# Patient Record
Sex: Male | Born: 1935 | Race: White | Hispanic: No | Marital: Married | Smoking: Former smoker
Health system: Southern US, Community
[De-identification: ages and names within clinical notes are randomized; demographics above are authoritative.]

## PROBLEM LIST (undated history)

## (undated) DIAGNOSIS — M1990 Unspecified osteoarthritis, unspecified site: Secondary | ICD-10-CM

## (undated) DIAGNOSIS — N189 Chronic kidney disease, unspecified: Secondary | ICD-10-CM

## (undated) DIAGNOSIS — E119 Type 2 diabetes mellitus without complications: Secondary | ICD-10-CM

## (undated) DIAGNOSIS — Z8673 Personal history of transient ischemic attack (TIA), and cerebral infarction without residual deficits: Secondary | ICD-10-CM

## (undated) DIAGNOSIS — I350 Nonrheumatic aortic (valve) stenosis: Secondary | ICD-10-CM

## (undated) DIAGNOSIS — J449 Chronic obstructive pulmonary disease, unspecified: Secondary | ICD-10-CM

## (undated) DIAGNOSIS — K219 Gastro-esophageal reflux disease without esophagitis: Secondary | ICD-10-CM

## (undated) DIAGNOSIS — E785 Hyperlipidemia, unspecified: Secondary | ICD-10-CM

## (undated) DIAGNOSIS — R609 Edema, unspecified: Secondary | ICD-10-CM

## (undated) DIAGNOSIS — J40 Bronchitis, not specified as acute or chronic: Secondary | ICD-10-CM

## (undated) DIAGNOSIS — R05 Cough: Secondary | ICD-10-CM

## (undated) DIAGNOSIS — R053 Chronic cough: Secondary | ICD-10-CM

## (undated) DIAGNOSIS — I10 Essential (primary) hypertension: Secondary | ICD-10-CM

## (undated) DIAGNOSIS — G629 Polyneuropathy, unspecified: Secondary | ICD-10-CM

## (undated) HISTORY — DX: Unspecified osteoarthritis, unspecified site: M19.90

## (undated) HISTORY — DX: Cough: R05

## (undated) HISTORY — DX: Personal history of transient ischemic attack (TIA), and cerebral infarction without residual deficits: Z86.73

## (undated) HISTORY — DX: Bronchitis, not specified as acute or chronic: J40

## (undated) HISTORY — DX: Nonrheumatic aortic (valve) stenosis: I35.0

## (undated) HISTORY — PX: HERNIA REPAIR: SHX51

## (undated) HISTORY — PX: ANKLE SURGERY: SHX546

## (undated) HISTORY — DX: Chronic cough: R05.3

## (undated) HISTORY — DX: Type 2 diabetes mellitus without complications: E11.9

## (undated) HISTORY — DX: Hyperlipidemia, unspecified: E78.5

## (undated) HISTORY — DX: Gastro-esophageal reflux disease without esophagitis: K21.9

## (undated) HISTORY — DX: Essential (primary) hypertension: I10

## (undated) HISTORY — PX: REPLACEMENT TOTAL KNEE: SUR1224

## (undated) HISTORY — DX: Chronic obstructive pulmonary disease, unspecified: J44.9

## (undated) HISTORY — DX: Polyneuropathy, unspecified: G62.9

## (undated) HISTORY — DX: Edema, unspecified: R60.9

---

## 1997-10-04 ENCOUNTER — Emergency Department (HOSPITAL_COMMUNITY): Admission: EM | Admit: 1997-10-04 | Discharge: 1997-10-04 | Payer: Self-pay | Admitting: Emergency Medicine

## 1997-10-24 ENCOUNTER — Ambulatory Visit (HOSPITAL_BASED_OUTPATIENT_CLINIC_OR_DEPARTMENT_OTHER): Admission: RE | Admit: 1997-10-24 | Discharge: 1997-10-24 | Payer: Self-pay | Admitting: Orthopedic Surgery

## 1998-04-04 ENCOUNTER — Encounter: Payer: Self-pay | Admitting: Emergency Medicine

## 1998-04-04 ENCOUNTER — Inpatient Hospital Stay (HOSPITAL_COMMUNITY): Admission: EM | Admit: 1998-04-04 | Discharge: 1998-04-05 | Payer: Self-pay | Admitting: Emergency Medicine

## 1998-04-04 ENCOUNTER — Encounter: Payer: Self-pay | Admitting: *Deleted

## 1998-10-09 ENCOUNTER — Ambulatory Visit (HOSPITAL_BASED_OUTPATIENT_CLINIC_OR_DEPARTMENT_OTHER): Admission: RE | Admit: 1998-10-09 | Discharge: 1998-10-09 | Payer: Self-pay | Admitting: Orthopedic Surgery

## 1998-11-05 ENCOUNTER — Ambulatory Visit (HOSPITAL_COMMUNITY): Admission: RE | Admit: 1998-11-05 | Discharge: 1998-11-05 | Payer: Self-pay | Admitting: Orthopedic Surgery

## 1999-04-16 ENCOUNTER — Ambulatory Visit: Admission: RE | Admit: 1999-04-16 | Discharge: 1999-04-16 | Payer: Self-pay | Admitting: *Deleted

## 1999-05-14 ENCOUNTER — Encounter (INDEPENDENT_AMBULATORY_CARE_PROVIDER_SITE_OTHER): Payer: Self-pay | Admitting: Specialist

## 1999-05-14 ENCOUNTER — Ambulatory Visit (HOSPITAL_COMMUNITY): Admission: RE | Admit: 1999-05-14 | Discharge: 1999-05-14 | Payer: Self-pay | Admitting: General Surgery

## 1999-07-07 ENCOUNTER — Encounter: Admission: RE | Admit: 1999-07-07 | Discharge: 1999-07-07 | Payer: Self-pay | Admitting: *Deleted

## 1999-07-07 ENCOUNTER — Encounter: Payer: Self-pay | Admitting: *Deleted

## 2000-01-04 ENCOUNTER — Encounter: Admission: RE | Admit: 2000-01-04 | Discharge: 2000-01-04 | Payer: Self-pay | Admitting: *Deleted

## 2000-01-04 ENCOUNTER — Encounter: Payer: Self-pay | Admitting: *Deleted

## 2000-10-12 ENCOUNTER — Encounter: Payer: Self-pay | Admitting: Orthopedic Surgery

## 2000-10-12 ENCOUNTER — Encounter: Admission: RE | Admit: 2000-10-12 | Discharge: 2000-10-12 | Payer: Self-pay | Admitting: Orthopedic Surgery

## 2000-10-13 ENCOUNTER — Ambulatory Visit (HOSPITAL_BASED_OUTPATIENT_CLINIC_OR_DEPARTMENT_OTHER): Admission: RE | Admit: 2000-10-13 | Discharge: 2000-10-14 | Payer: Self-pay | Admitting: Orthopedic Surgery

## 2001-04-11 ENCOUNTER — Encounter: Admission: RE | Admit: 2001-04-11 | Discharge: 2001-07-10 | Payer: Self-pay | Admitting: *Deleted

## 2003-10-07 ENCOUNTER — Encounter: Admission: RE | Admit: 2003-10-07 | Discharge: 2004-01-05 | Payer: Self-pay | Admitting: Family Medicine

## 2004-12-28 ENCOUNTER — Encounter: Admission: RE | Admit: 2004-12-28 | Discharge: 2004-12-28 | Payer: Self-pay | Admitting: Family Medicine

## 2005-08-02 ENCOUNTER — Encounter: Admission: RE | Admit: 2005-08-02 | Discharge: 2005-08-02 | Payer: Self-pay | Admitting: Family Medicine

## 2006-03-24 ENCOUNTER — Ambulatory Visit (HOSPITAL_BASED_OUTPATIENT_CLINIC_OR_DEPARTMENT_OTHER): Admission: RE | Admit: 2006-03-24 | Discharge: 2006-03-24 | Payer: Self-pay | Admitting: Orthopedic Surgery

## 2006-04-19 ENCOUNTER — Encounter (INDEPENDENT_AMBULATORY_CARE_PROVIDER_SITE_OTHER): Payer: Self-pay | Admitting: *Deleted

## 2006-04-19 ENCOUNTER — Ambulatory Visit (HOSPITAL_COMMUNITY): Admission: RE | Admit: 2006-04-19 | Discharge: 2006-04-19 | Payer: Self-pay | Admitting: Orthopedic Surgery

## 2006-12-07 ENCOUNTER — Encounter: Admission: RE | Admit: 2006-12-07 | Discharge: 2006-12-07 | Payer: Self-pay | Admitting: Family Medicine

## 2007-01-16 ENCOUNTER — Inpatient Hospital Stay (HOSPITAL_COMMUNITY): Admission: RE | Admit: 2007-01-16 | Discharge: 2007-01-19 | Payer: Self-pay | Admitting: Orthopedic Surgery

## 2007-02-02 ENCOUNTER — Ambulatory Visit (HOSPITAL_COMMUNITY): Admission: RE | Admit: 2007-02-02 | Discharge: 2007-02-02 | Payer: Self-pay | Admitting: Orthopedic Surgery

## 2007-02-02 ENCOUNTER — Encounter (INDEPENDENT_AMBULATORY_CARE_PROVIDER_SITE_OTHER): Payer: Self-pay | Admitting: Orthopedic Surgery

## 2007-02-02 ENCOUNTER — Ambulatory Visit: Payer: Self-pay | Admitting: Vascular Surgery

## 2007-02-06 ENCOUNTER — Encounter: Admission: RE | Admit: 2007-02-06 | Discharge: 2007-02-17 | Payer: Self-pay | Admitting: Orthopedic Surgery

## 2007-06-14 HISTORY — PX: US ECHOCARDIOGRAPHY: HXRAD669

## 2009-02-24 IMAGING — CR DG CHEST 2V
3 series · 3 of 3 positions shown · non-contrast
Comparison: 08/02/05

CLINICAL DATA: 83 year old with chronic cough.
 CHEST - 2 VIEW:

[view not recorded (1 of 3)]
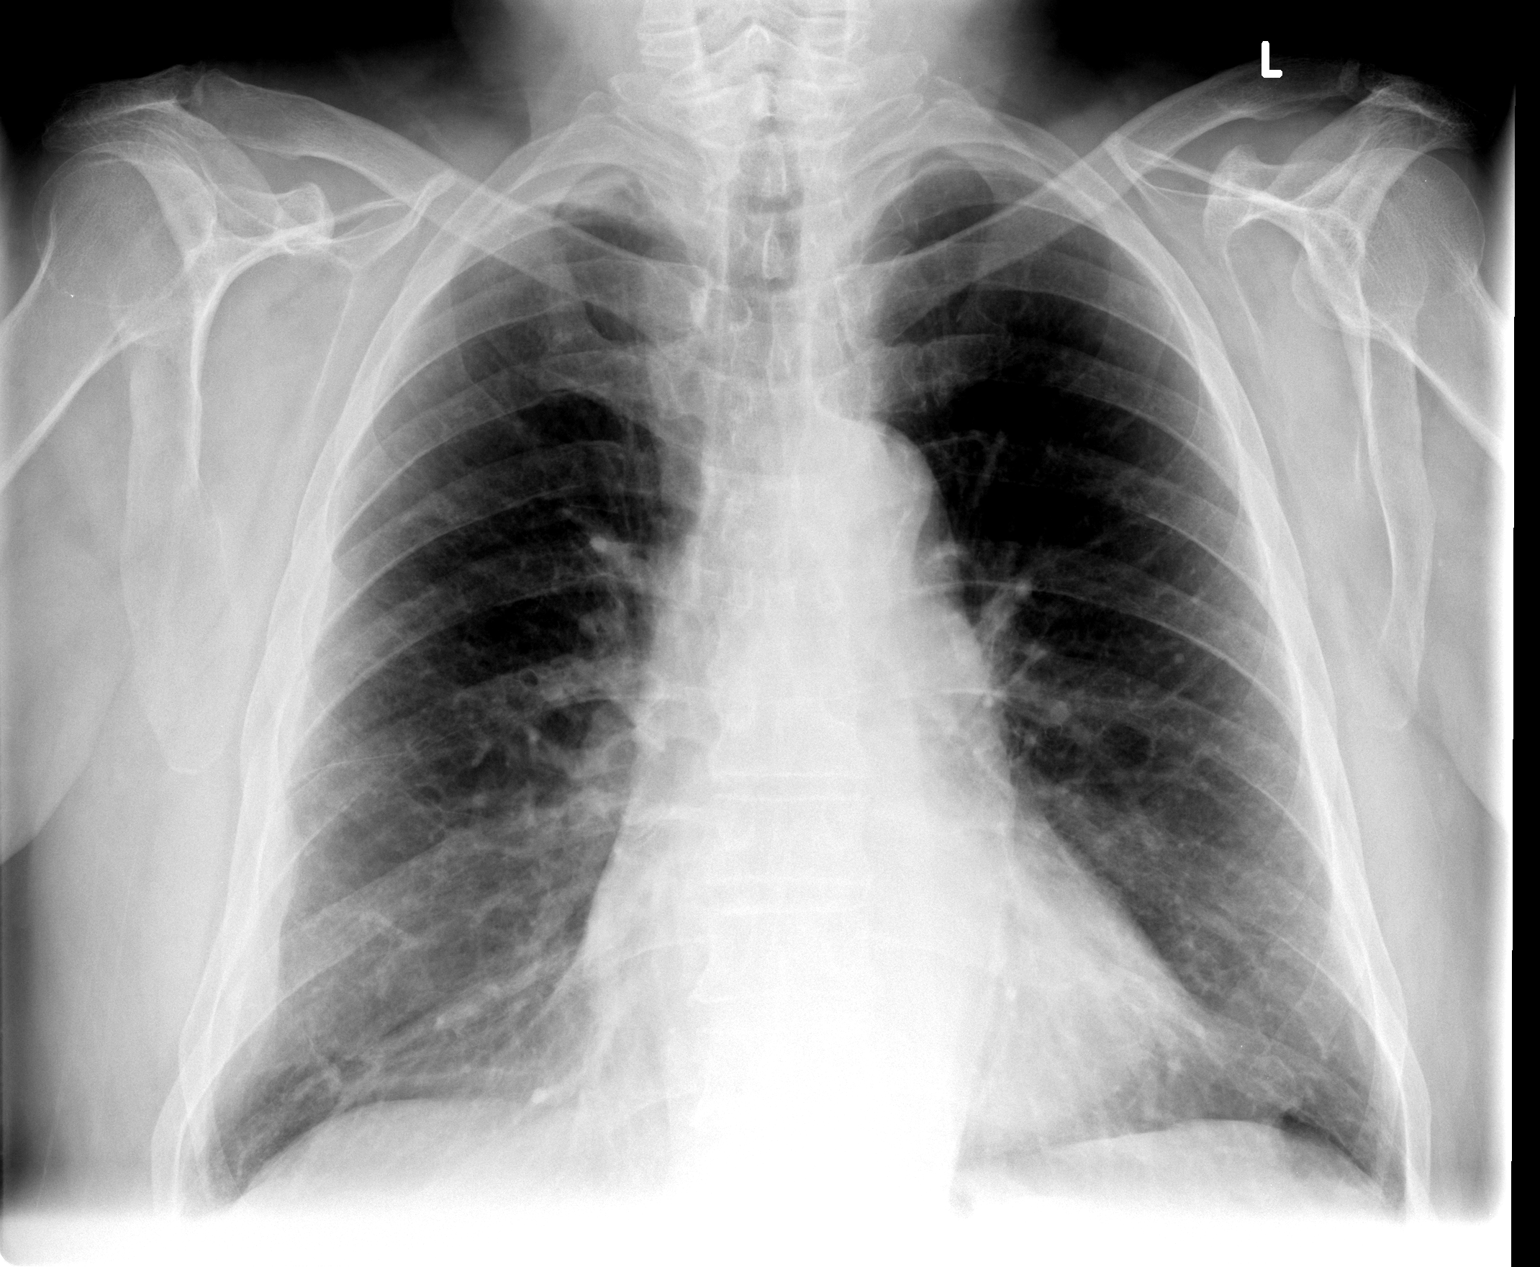

[view not recorded (2 of 3)]
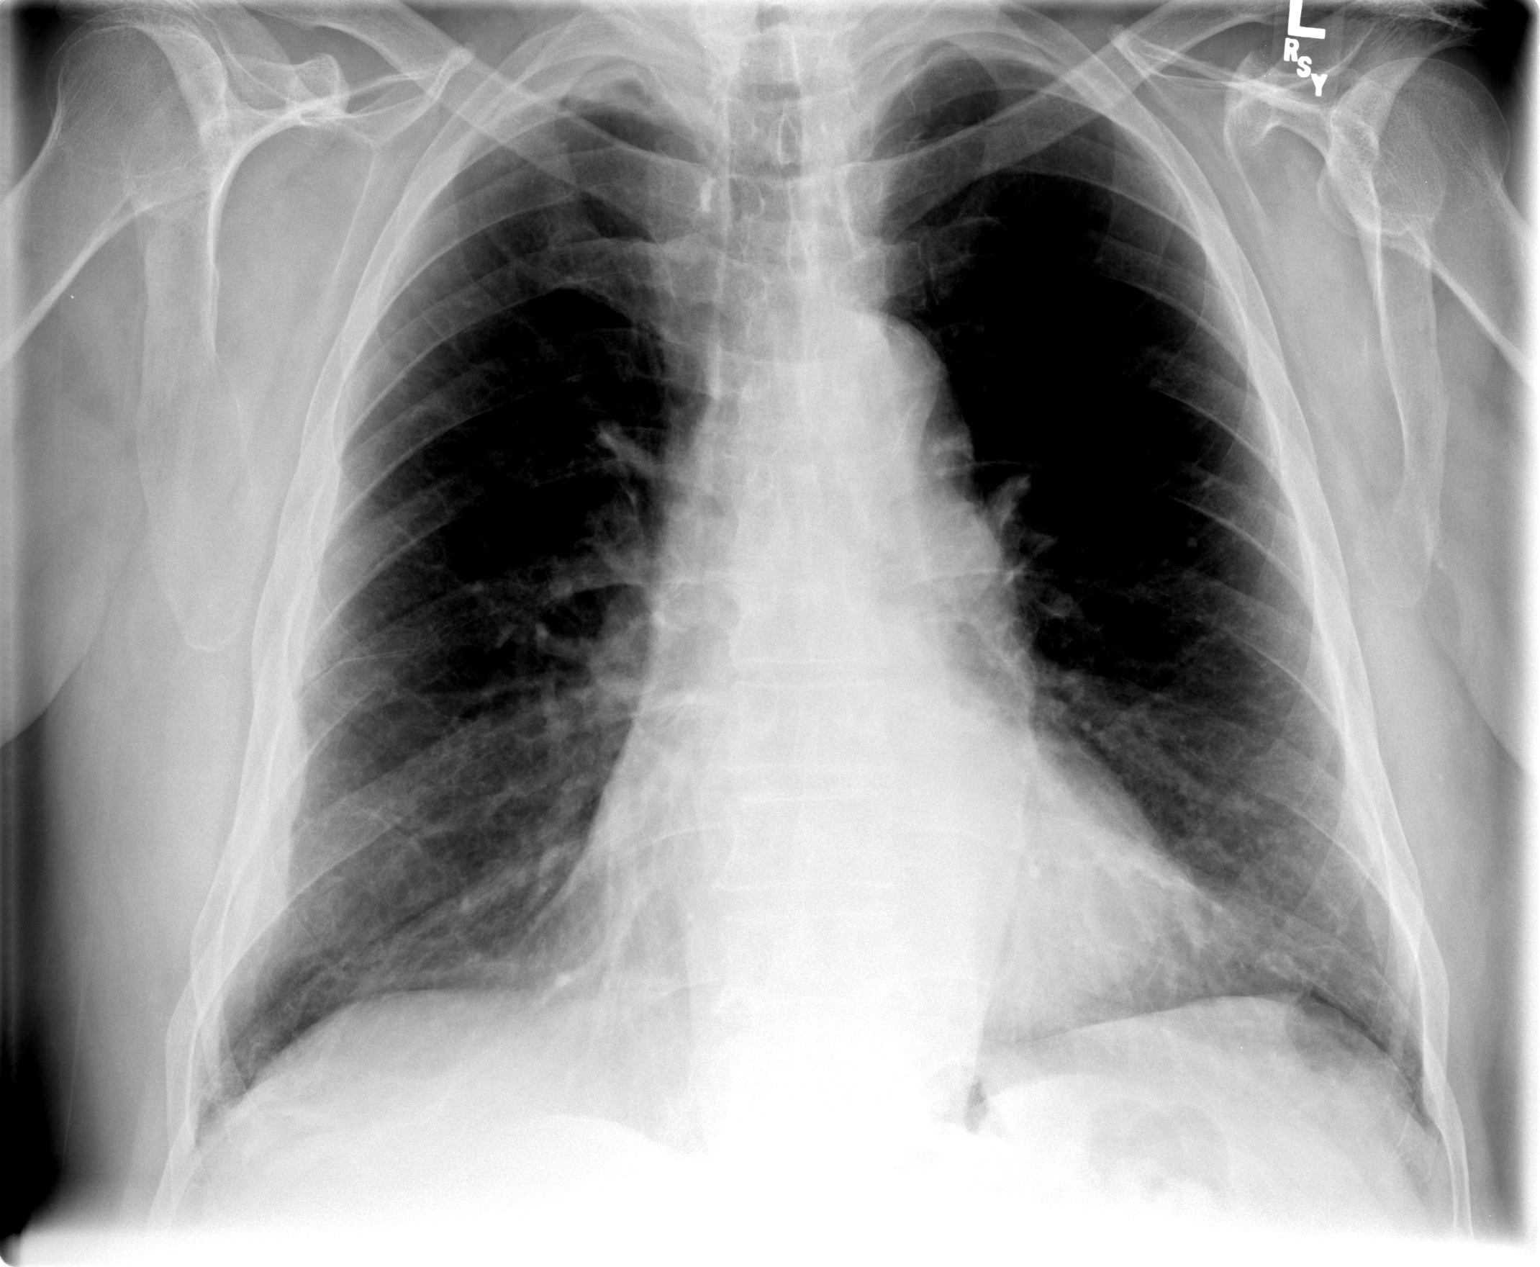

[view not recorded (3 of 3)]
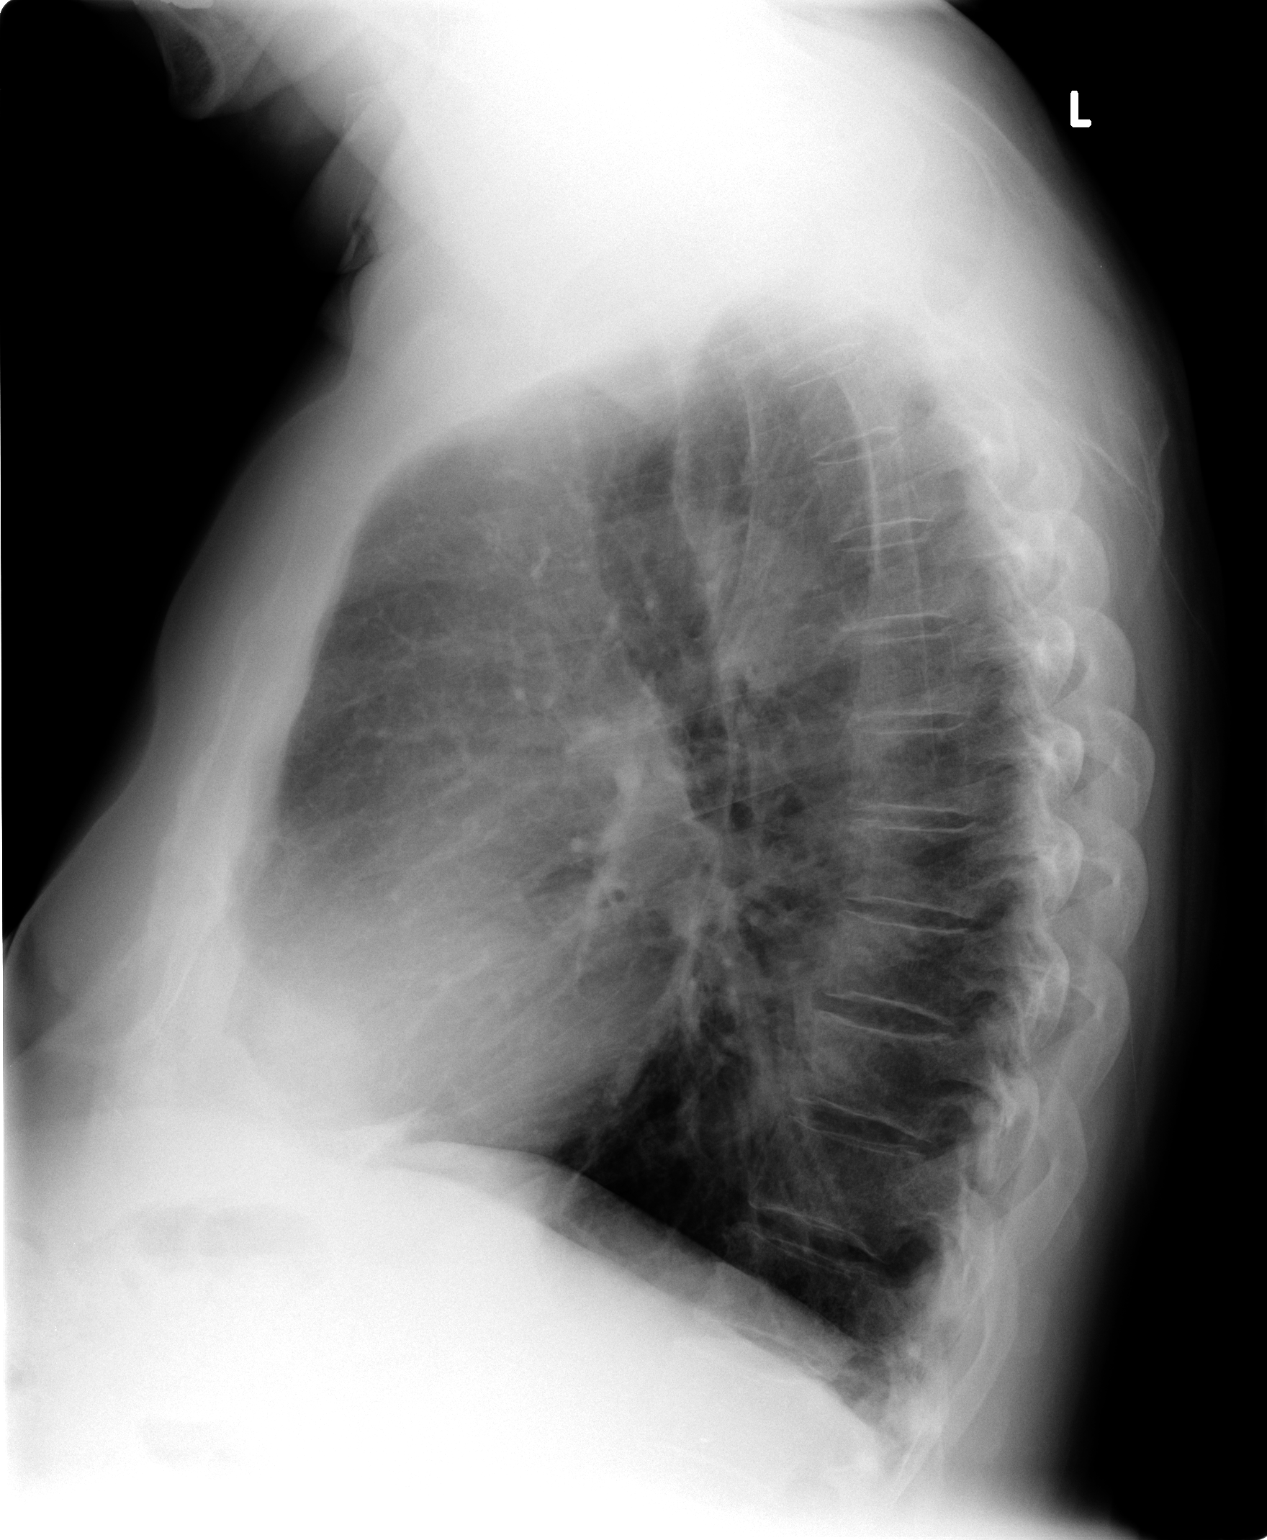

[3 of 3 positions shown; findings below may reference images not displayed]

FINDINGS: The cardiac silhouette, mediastinal, and hilar contours are within normal limits and stable.  There is mild tortuosity, ectasia and calcification of the thoracic aorta which is unchanged.  There are chronic interstitial lung changes and apical pleural scarring.  No acute pulmonary findings.  The bony structures are intact.
IMPRESSION: No acute cardiopulmonary findings.  Chronic lung changes.

## 2010-11-17 NOTE — Op Note (Signed)
NAMEKEELON, ZURN NO.:  [id number]   MEDICAL RECORD NO.:  [id number]          PATIENT TYPE:  INP   LOCATION:  5020                         FACILITY:  [facility]   PHYSICIAN:  Loreta Ave, M.D. DATE OF BIRTH:  [birth year]   DATE OF PROCEDURE:  01/16/2007  DATE OF DISCHARGE:                               OPERATIVE REPORT   PREOPERATIVE DIAGNOSIS:  End-stage degenerative arthritis, left knee,  varus alignment.   POSTOPERATIVE DIAGNOSIS:  End-stage degenerative arthritis, left knee,  varus alignment.   OPERATIVE PROCEDURE:  Left total knee replacement, [employer]  components.  Minimally invasive system.  Cemented pegged posterior  stabilized #5 femoral component.  Cemented #6 tibial component with 9-mm  polyethylene insert.  Resurfacing cemented pegged medial offset 38 x 10-  mm patellar component.  Soft tissue balancing with medial capsule  release.   SURGEON:  Loreta Ave, M.D.   ASSISTANT:  Zonia Kief, P.A. who was present throughout the entire  case.   ANESTHESIA:  General.   BLOOD LOSS:  Minimal.   TOURNIQUET TIME:  1 hour and 15 minutes.   SPECIMENS:  None.   CULTURES:  None.   COMPLICATIONS:  None.   DRESSING:  Soft compressive knee immobilizer.   DRAIN:  Hemovac x1.   PROCEDURE:  The patient was brought to the operating room and placed on  the operating table in the supine position.  After adequate anesthesia  had been obtained, the left knee examined.  Very minimal flexion  contracture.  Further flexion a little bit better than 100 degrees.  Varus alignment stable ligaments.  Tourniquet applied.  Prepped and  draped in usual sterile fashion.  Exsanguinated with elevation of  Esmarch, tourniquet inflated 350 mmHg.  Straight incision above the  patella down to the tibial tubercle.  Medial arthrotomy vastus splitting  incision.  Knee exposed.  Medial capsule release.  Grade 4 changes, most  marked medially.  Distal femur  exposed.  10-mm resection set at 5  degrees of valgus.  Epicondylar axis marked.  Size cut and fitted for a  pegged posterior stabilized #5 component.  Tibia exposed.  Extramedullary guide.  3-degree posterior slope cut.  Sufficient  resection for a 9-mm component.  Sized for a  #6 component.  Trials put  in place.  #5 on the femur, #6 on the tibia.  With the 9-mm insert and  after resection of all spurs, the remnants of cruciate ligament and  menisci and soft tissue clearing and medial capsule release, I had nice  balanced knee with full extension, full flexion, and no lift off on  flexion.  Tibia was marked for appropriate rotation and hand reamed for  definitive component.  Patella was measured, prepared, and the posterior  10 mm removed.  It was then drilled and fitted with a 38-mm medial  offset component.  With all trials in place, also excellent  patellofemoral tracking.  All trials removed.  Copious irrigation of the  knee with a pulse irrigating device.  Cement prepared and placed on all  components which were firmly  seated.  Polyethylene attached to tibia.  Knee reduced.  Once cement hardened, the knee was reexamined.  Full  extension, full flexion, good stability, good patellofemoral tracking.  Hemovac placed and brought out through a separate stab wound.  Arthrotomy closed with #1 Vicryl, the skin and subcutaneous tissue with  Vicryl and staples.  Margin of wound injected with Marcaine.  Sterile  compressive dressing applied.  Knee immobilizer applied after tourniquet  deflated.  Anesthesia reversed.  Brought to the recovery room.  Tolerated surgery well.  No complications.      Loreta Ave, M.D.  Electronically Signed     DFM/MEDQ  D:  01/16/2007  T:  01/17/2007  Job:  [id number]

## 2010-11-20 NOTE — Discharge Summary (Signed)
NAMEGIOVAN, PINSKY NO.:  [id number]   MEDICAL RECORD NO.:  [id number]          PATIENT TYPE:  INP   LOCATION:  5020                         FACILITY:  [facility]   PHYSICIAN:  Loreta Ave, M.D. DATE OF BIRTH:  07-Nov-1923   DATE OF ADMISSION:  01/16/2007  DATE OF DISCHARGE:  01/19/2007                               DISCHARGE SUMMARY   FINAL DIAGNOSES:  1. Status post left total knee replacement for end-stage degenerative      joint disease.  2. Hypertension.  3. Hypercholesterolemia.  4. Type 2 diabetes mellitus.   HISTORY OF PRESENT ILLNESS:  This is an 77 year old white male with  history of end-stage DJD of left knee in chronic pain who presented to  our office for a postoperative evaluation for total knee replacement.  He had progressive worsening pain with failed response with conservative  treatment.  Significant decrease in his daily activities due to the  ongoing complaint.   HOSPITAL COURSE:  On January 16, 2007 the patient was taken to the [facility] operating room and a left total knee replacement procedure  performed.  Surgeon was Mckinley Jewel, M.D. and assistant Zonia Kief,  [facility].  Anesthesia general.  No specimens.  Estimated blood loss minimal.  Tourniquet time 1 hour and 38 minutes.  One Hemovac drain was placed.  There were no surgical or anesthesia complications and the patient was  transferred to recovery in stable condition.  On January 17, 2007 the  patient was doing well with good pain control.  Vital signs stable,  afebrile.  Hematocrit 33.3, hemoglobin 11.3.  Sodium 131, glucose 126.  INR of 1.2.  Dressing clean, dry and intact.  Neurovascularly intact.  Changed IV to normal saline plus 20 of Kay Ciel.  Pharmacy protocol  Coumadin started.  On January 18, 2007 the patient was doing well with good  pain control, progressing with PT. No complaints of chest pain,  shortness of breath.  Temperature 96, pulse 104, respirations 18, blood  pressure 120/60.  Hemoglobin 10.4, hematocrit 36, sodium of 31,  potassium of 5.7, chloride of 125, BUN of 18, creatinine 1.5, glucose  125, INR was 1.6.  Wound looked good, staples intact.  Hemovac drain  discontinued.  No signs of infection. __________ .  He was given a 500  cc bolus of saline and heparin-locked IV. __________  On January 19, 2007  the patient looks great. Progressed very good with therapy.  He stated  that he is ready to go home.  No complaints.  Vitals were stable.  Afebrile.  WBC 9400, hematocrit 29.9, hemoglobin 10.3, platelets  188,000. Sodium 132, potassium 3.5, chloride 99, CO2 26, BUN of 19,  creatinine 0.4, glucose 94.6. Wound looks good, staples intact.   Patient discharged on a sodium free diet for the next couple of days.   DISPOSITION:  Discharged home with condition stable.   MEDICATIONS:  1. Percocet 325/500 1-2 tablets p.o. q.4-6 hours p.r.n. pain.  2. Robaxin 500 mg tablet p.o. q.6 hours for spasm.  3. Coumadin with pharmacy protocol.  4. Lovenox  40 mg SQ injections x3 days, therapeutic with INR 2.3.  5. Resume previous medicines.   INSTRUCTIONS:  Patient to work with both PT and OT with ambulation to  increase strength.  Daily dressing changes 4 x 4 gauze and tape.  Postoperative DVT prophylaxis.  Follow up with me for 2-week  postoperative recheck.  Possibly remove stables at that time.  To call  me with questions or concerns.      Loreta Ave, M.D.  Electronically Signed     DFM/MEDQ  D:  02/24/2007  T:  02/24/2007  Job:  [id number]

## 2011-04-19 LAB — BASIC METABOLIC PANEL
BUN: 18
BUN: 19
CO2: 27
Calcium: 7.9 — ABNORMAL LOW
Calcium: 8.2 — ABNORMAL LOW
Chloride: 101
Chloride: 96
Chloride: 99
Creatinine, Ser: 0.94
Creatinine, Ser: 1.15
GFR calc Af Amer: >60
GFR calc Af Amer: >60
Glucose, Bld: 125 — ABNORMAL HIGH
Potassium: 3.9

## 2011-04-19 LAB — CBC
HCT: 29.9 — ABNORMAL LOW
HCT: 30.6 — ABNORMAL LOW
Hemoglobin: 10.3 — ABNORMAL LOW
MCHC: 33.9
MCHC: 34
MCV: 95.4
MCV: 95.7
Platelets: 169
RDW: 13.1
RDW: 13.2
WBC: 11.3 — ABNORMAL HIGH

## 2011-04-19 LAB — PROTIME-INR
INR: 1.6 — ABNORMAL HIGH
Prothrombin Time: 19.6 — ABNORMAL HIGH

## 2011-04-20 LAB — CBC
Hemoglobin: 14
MCHC: 33.5
MCV: 95.5
Platelets: 256
RBC: 4.36
WBC: 8.8

## 2011-04-20 LAB — COMPREHENSIVE METABOLIC PANEL
ALT: 29
AST: 29
Albumin: 3.6
Alkaline Phosphatase: 49
BUN: 25 — ABNORMAL HIGH
CO2: 29
Calcium: 9.5
GFR calc Af Amer: >60
GFR calc non Af Amer: >60
Glucose, Bld: 111 — ABNORMAL HIGH
Total Protein: 6.9

## 2011-04-20 LAB — URINALYSIS, ROUTINE W REFLEX MICROSCOPIC
Glucose, UA: NEGATIVE
Nitrite: NEGATIVE
Protein, ur: NEGATIVE
Specific Gravity, Urine: 1.018

## 2011-04-20 LAB — TYPE AND SCREEN
ABO/RH(D): O POS
Antibody Screen: NEGATIVE

## 2011-04-20 LAB — APTT: aPTT: 47 — ABNORMAL HIGH

## 2011-05-13 ENCOUNTER — Encounter: Payer: Self-pay | Admitting: Cardiovascular Disease

## 2011-05-18 ENCOUNTER — Ambulatory Visit (INDEPENDENT_AMBULATORY_CARE_PROVIDER_SITE_OTHER): Payer: Medicare Other | Admitting: Cardiovascular Disease

## 2011-05-18 ENCOUNTER — Encounter: Payer: Self-pay | Admitting: Cardiovascular Disease

## 2011-05-18 DIAGNOSIS — I10 Essential (primary) hypertension: Secondary | ICD-10-CM | POA: Insufficient documentation

## 2011-05-18 DIAGNOSIS — I350 Nonrheumatic aortic (valve) stenosis: Secondary | ICD-10-CM

## 2011-05-18 DIAGNOSIS — I359 Nonrheumatic aortic valve disorder, unspecified: Secondary | ICD-10-CM

## 2011-05-18 NOTE — Assessment & Plan Note (Signed)
His BP is moderately elevated today - he remarked that it has been a stressful day.  Advised him to continue to watch his salt and to exercise regularly

## 2011-05-18 NOTE — Patient Instructions (Signed)
Your physician wants you to follow-up in: 1 year or sooner if you need,  You will receive a reminder letter in the mail two months in advance. If you don't receive a letter, please call our office to schedule the follow-up appointment.

## 2011-05-18 NOTE — Assessment & Plan Note (Signed)
His AS is mild and is stable.  Will continue to watch.

## 2011-05-18 NOTE — Progress Notes (Signed)
  Lavonia Dana Date of Birth  July 07, 1923 [facility] [address]    [address] [place], [place]  [address] [phone]  Fax  [phone]  History of Present Illness:  Shelly is an 77 year old gentleman with a history of mild aortic stenosis, hypercholesterolemia, and hypertension. He also has a history of COPD.  He's done fairly well. He's been on all of his medications. He has been under some stress today so his blood pressure is a little elevated to  Current Outpatient Prescriptions on File Prior to Visit  Medication Sig Dispense Refill  . carvedilol (COREG) 6.25 MG tablet Take 6.25 mg by mouth 2 (two) times daily with a meal.        . simvastatin (ZOCOR) 40 MG tablet Take 80 mg by mouth at bedtime.       Marland Kitchen VITAMIN D, CHOLECALCIFEROL, PO Take 500 Units by mouth every other day.          Allergies  Allergen Reactions  . Nexium     Past Medical History  Diagnosis Date  . Hyperlipidemia   . Hypertension   . COPD (chronic obstructive pulmonary disease)   . Vitamin D deficiency   . Aortic stenosis   . GERD (gastroesophageal reflux disease)   . History of TIAs   . Chronic cough   . DJD (degenerative joint disease)   . Edema   . Bronchitis     Past Surgical History  Procedure Date  . Replacement total knee   . Hernia repair   . Ankle surgery   . US echocardiography 06/14/2007    EF 55-60%    History  Smoking status  . Former Smoker  . Quit date: 05/12/1996  Smokeless tobacco  . Not on file    History  Alcohol Use No    Family History  Problem Relation Age of Onset  . Pneumonia Mother   . Pneumonia Father     Reviw of Systems:  Reviewed in the HPI.  All other systems are negative.  Physical Exam: BP 178/88  Pulse 71  Ht 5' 9.5" (1.765 m)  Wt 241 lb 1.9 oz (109.371 kg)  BMI 35.10 kg/m2 The patient is alert and oriented x 3.  The mood and affect are normal.   Skin: warm and dry.  Color is normal.    HEENT:   /AT, normal carotids, no  JVD  Lungs: clear   Heart: RR, soft systolic murmur    Abdomen: + BS, mild obesity  Extremities:  No c/c/e  Neuro:  Non focal    ECG: Normal sinus rhythm. He has moderate voltage for left ventricular hypertrophy.  Assessment / Plan:

## 2014-08-08 ENCOUNTER — Other Ambulatory Visit (HOSPITAL_COMMUNITY): Payer: Self-pay | Admitting: Family Medicine

## 2014-08-08 DIAGNOSIS — R600 Localized edema: Secondary | ICD-10-CM

## 2014-08-08 DIAGNOSIS — I350 Nonrheumatic aortic (valve) stenosis: Secondary | ICD-10-CM

## 2014-08-09 ENCOUNTER — Ambulatory Visit (HOSPITAL_COMMUNITY): Payer: No Typology Code available for payment source

## 2014-08-13 ENCOUNTER — Ambulatory Visit (HOSPITAL_BASED_OUTPATIENT_CLINIC_OR_DEPARTMENT_OTHER)
Admission: RE | Admit: 2014-08-13 | Discharge: 2014-08-13 | Disposition: A | Discharge status: Home / Self Care | Payer: Medicare Other | Source: Ambulatory Visit | Attending: Cardiology | Admitting: Cardiology

## 2014-08-13 ENCOUNTER — Ambulatory Visit (HOSPITAL_COMMUNITY)
Admission: RE | Admit: 2014-08-13 | Discharge: 2014-08-13 | Disposition: A | Discharge status: Home / Self Care | Payer: Medicare Other | Source: Ambulatory Visit | Attending: Cardiology | Admitting: Cardiology

## 2014-08-13 DIAGNOSIS — E785 Hyperlipidemia, unspecified: Secondary | ICD-10-CM | POA: Insufficient documentation

## 2014-08-13 DIAGNOSIS — I10 Essential (primary) hypertension: Secondary | ICD-10-CM | POA: Insufficient documentation

## 2014-08-13 DIAGNOSIS — J449 Chronic obstructive pulmonary disease, unspecified: Secondary | ICD-10-CM | POA: Diagnosis not present

## 2014-08-13 DIAGNOSIS — I350 Nonrheumatic aortic (valve) stenosis: Secondary | ICD-10-CM

## 2014-08-13 DIAGNOSIS — R600 Localized edema: Secondary | ICD-10-CM

## 2014-08-13 DIAGNOSIS — I359 Nonrheumatic aortic valve disorder, unspecified: Secondary | ICD-10-CM | POA: Insufficient documentation

## 2014-08-13 NOTE — Progress Notes (Signed)
ABI Completed. No evidence for significant arterial insufficiency, wounds indicate possible venous insufficiency. Brianna L Mazza,RVT

## 2014-08-13 NOTE — Progress Notes (Signed)
2D Echo Performed 08/13/2014    Dimitriy Carreras, RCS  

## 2014-09-06 ENCOUNTER — Ambulatory Visit (HOSPITAL_COMMUNITY)
Admission: RE | Admit: 2014-09-06 | Discharge: 2014-09-06 | Disposition: A | Discharge status: Home / Self Care | Payer: Medicare Other | Source: Ambulatory Visit | Attending: Internal Medicine | Admitting: Internal Medicine

## 2014-09-06 ENCOUNTER — Encounter (HOSPITAL_BASED_OUTPATIENT_CLINIC_OR_DEPARTMENT_OTHER): Payer: Medicare Other | Attending: Internal Medicine

## 2014-09-06 ENCOUNTER — Other Ambulatory Visit (HOSPITAL_COMMUNITY): Payer: Self-pay | Admitting: Internal Medicine

## 2014-09-06 DIAGNOSIS — L97821 Non-pressure chronic ulcer of other part of left lower leg limited to breakdown of skin: Secondary | ICD-10-CM | POA: Insufficient documentation

## 2014-09-06 DIAGNOSIS — I8312 Varicose veins of left lower extremity with inflammation: Secondary | ICD-10-CM | POA: Diagnosis present

## 2014-09-06 DIAGNOSIS — E0842 Diabetes mellitus due to underlying condition with diabetic polyneuropathy: Secondary | ICD-10-CM | POA: Diagnosis not present

## 2014-09-06 DIAGNOSIS — I83222 Varicose veins of left lower extremity with both ulcer of calf and inflammation: Secondary | ICD-10-CM | POA: Diagnosis not present

## 2014-09-06 DIAGNOSIS — E1151 Type 2 diabetes mellitus with diabetic peripheral angiopathy without gangrene: Secondary | ICD-10-CM | POA: Diagnosis not present

## 2014-09-06 DIAGNOSIS — I83229 Varicose veins of left lower extremity with both ulcer of unspecified site and inflammation: Secondary | ICD-10-CM

## 2014-09-06 DIAGNOSIS — L97929 Non-pressure chronic ulcer of unspecified part of left lower leg with unspecified severity: Secondary | ICD-10-CM

## 2014-09-06 DIAGNOSIS — E0851 Diabetes mellitus due to underlying condition with diabetic peripheral angiopathy without gangrene: Secondary | ICD-10-CM

## 2014-09-06 DIAGNOSIS — L97521 Non-pressure chronic ulcer of other part of left foot limited to breakdown of skin: Secondary | ICD-10-CM | POA: Insufficient documentation

## 2014-09-06 DIAGNOSIS — I872 Venous insufficiency (chronic) (peripheral): Secondary | ICD-10-CM | POA: Diagnosis present

## 2014-09-06 DIAGNOSIS — I83221 Varicose veins of left lower extremity with both ulcer of thigh and inflammation: Secondary | ICD-10-CM | POA: Diagnosis not present

## 2014-09-06 NOTE — Progress Notes (Signed)
VASCULAR LAB PRELIMINARY  PRELIMINARY  PRELIMINARY  PRELIMINARY  Right lower extremity venous duplex completed.    Preliminary report:  Right:  No evidence of DVT, superficial thrombosis, or Baker's cyst.  Jarad Barth, RVS 09/06/2014, 11:09 AM

## 2014-09-09 DIAGNOSIS — I872 Venous insufficiency (chronic) (peripheral): Secondary | ICD-10-CM | POA: Diagnosis not present

## 2014-09-16 DIAGNOSIS — L97821 Non-pressure chronic ulcer of other part of left lower leg limited to breakdown of skin: Secondary | ICD-10-CM | POA: Diagnosis not present

## 2014-09-16 DIAGNOSIS — E0842 Diabetes mellitus due to underlying condition with diabetic polyneuropathy: Secondary | ICD-10-CM | POA: Diagnosis not present

## 2014-09-16 DIAGNOSIS — L97521 Non-pressure chronic ulcer of other part of left foot limited to breakdown of skin: Secondary | ICD-10-CM | POA: Diagnosis not present

## 2014-09-16 DIAGNOSIS — I872 Venous insufficiency (chronic) (peripheral): Secondary | ICD-10-CM | POA: Diagnosis not present

## 2014-09-23 DIAGNOSIS — L97821 Non-pressure chronic ulcer of other part of left lower leg limited to breakdown of skin: Secondary | ICD-10-CM | POA: Diagnosis not present

## 2014-09-23 DIAGNOSIS — E0842 Diabetes mellitus due to underlying condition with diabetic polyneuropathy: Secondary | ICD-10-CM | POA: Diagnosis not present

## 2014-09-23 DIAGNOSIS — I872 Venous insufficiency (chronic) (peripheral): Secondary | ICD-10-CM | POA: Diagnosis not present

## 2014-09-23 DIAGNOSIS — L97521 Non-pressure chronic ulcer of other part of left foot limited to breakdown of skin: Secondary | ICD-10-CM | POA: Diagnosis not present

## 2014-09-30 DIAGNOSIS — L97521 Non-pressure chronic ulcer of other part of left foot limited to breakdown of skin: Secondary | ICD-10-CM | POA: Diagnosis not present

## 2014-09-30 DIAGNOSIS — E0842 Diabetes mellitus due to underlying condition with diabetic polyneuropathy: Secondary | ICD-10-CM | POA: Diagnosis not present

## 2014-09-30 DIAGNOSIS — L97821 Non-pressure chronic ulcer of other part of left lower leg limited to breakdown of skin: Secondary | ICD-10-CM | POA: Diagnosis not present

## 2014-09-30 DIAGNOSIS — I872 Venous insufficiency (chronic) (peripheral): Secondary | ICD-10-CM | POA: Diagnosis not present

## 2014-10-07 ENCOUNTER — Encounter (HOSPITAL_BASED_OUTPATIENT_CLINIC_OR_DEPARTMENT_OTHER): Payer: Medicare Other | Attending: Plastic Surgery

## 2014-10-07 DIAGNOSIS — E1140 Type 2 diabetes mellitus with diabetic neuropathy, unspecified: Secondary | ICD-10-CM | POA: Diagnosis not present

## 2014-10-07 DIAGNOSIS — E11621 Type 2 diabetes mellitus with foot ulcer: Secondary | ICD-10-CM | POA: Diagnosis not present

## 2014-10-07 DIAGNOSIS — L97821 Non-pressure chronic ulcer of other part of left lower leg limited to breakdown of skin: Secondary | ICD-10-CM | POA: Insufficient documentation

## 2014-10-07 DIAGNOSIS — L97521 Non-pressure chronic ulcer of other part of left foot limited to breakdown of skin: Secondary | ICD-10-CM | POA: Insufficient documentation

## 2014-10-07 DIAGNOSIS — I87332 Chronic venous hypertension (idiopathic) with ulcer and inflammation of left lower extremity: Secondary | ICD-10-CM | POA: Diagnosis not present

## 2014-10-14 DIAGNOSIS — E11621 Type 2 diabetes mellitus with foot ulcer: Secondary | ICD-10-CM | POA: Diagnosis not present

## 2014-10-14 DIAGNOSIS — I87332 Chronic venous hypertension (idiopathic) with ulcer and inflammation of left lower extremity: Secondary | ICD-10-CM | POA: Diagnosis not present

## 2014-10-14 DIAGNOSIS — L97821 Non-pressure chronic ulcer of other part of left lower leg limited to breakdown of skin: Secondary | ICD-10-CM | POA: Diagnosis not present

## 2014-10-14 DIAGNOSIS — L97521 Non-pressure chronic ulcer of other part of left foot limited to breakdown of skin: Secondary | ICD-10-CM | POA: Diagnosis not present

## 2014-10-28 DIAGNOSIS — L97521 Non-pressure chronic ulcer of other part of left foot limited to breakdown of skin: Secondary | ICD-10-CM | POA: Diagnosis not present

## 2014-10-28 DIAGNOSIS — E11621 Type 2 diabetes mellitus with foot ulcer: Secondary | ICD-10-CM | POA: Diagnosis not present

## 2014-10-28 DIAGNOSIS — L97821 Non-pressure chronic ulcer of other part of left lower leg limited to breakdown of skin: Secondary | ICD-10-CM | POA: Diagnosis not present

## 2014-10-28 DIAGNOSIS — I87332 Chronic venous hypertension (idiopathic) with ulcer and inflammation of left lower extremity: Secondary | ICD-10-CM | POA: Diagnosis not present

## 2014-11-04 ENCOUNTER — Encounter (HOSPITAL_BASED_OUTPATIENT_CLINIC_OR_DEPARTMENT_OTHER): Payer: Medicare Other | Attending: Plastic Surgery

## 2014-11-04 DIAGNOSIS — L97901 Non-pressure chronic ulcer of unspecified part of unspecified lower leg limited to breakdown of skin: Secondary | ICD-10-CM | POA: Insufficient documentation

## 2014-11-04 DIAGNOSIS — R609 Edema, unspecified: Secondary | ICD-10-CM | POA: Insufficient documentation

## 2014-11-04 DIAGNOSIS — I771 Stricture of artery: Secondary | ICD-10-CM | POA: Insufficient documentation

## 2014-11-04 DIAGNOSIS — E11622 Type 2 diabetes mellitus with other skin ulcer: Secondary | ICD-10-CM | POA: Insufficient documentation

## 2014-11-04 DIAGNOSIS — L97921 Non-pressure chronic ulcer of unspecified part of left lower leg limited to breakdown of skin: Secondary | ICD-10-CM | POA: Insufficient documentation

## 2014-11-04 DIAGNOSIS — I87332 Chronic venous hypertension (idiopathic) with ulcer and inflammation of left lower extremity: Secondary | ICD-10-CM | POA: Insufficient documentation

## 2014-11-04 DIAGNOSIS — L97911 Non-pressure chronic ulcer of unspecified part of right lower leg limited to breakdown of skin: Secondary | ICD-10-CM | POA: Insufficient documentation

## 2014-11-04 DIAGNOSIS — L97821 Non-pressure chronic ulcer of other part of left lower leg limited to breakdown of skin: Secondary | ICD-10-CM | POA: Insufficient documentation

## 2014-11-06 DIAGNOSIS — L97911 Non-pressure chronic ulcer of unspecified part of right lower leg limited to breakdown of skin: Secondary | ICD-10-CM | POA: Diagnosis not present

## 2014-11-06 DIAGNOSIS — I771 Stricture of artery: Secondary | ICD-10-CM | POA: Diagnosis not present

## 2014-11-06 DIAGNOSIS — E11622 Type 2 diabetes mellitus with other skin ulcer: Secondary | ICD-10-CM | POA: Diagnosis not present

## 2014-11-06 DIAGNOSIS — I87332 Chronic venous hypertension (idiopathic) with ulcer and inflammation of left lower extremity: Secondary | ICD-10-CM | POA: Diagnosis not present

## 2014-11-06 DIAGNOSIS — R609 Edema, unspecified: Secondary | ICD-10-CM | POA: Diagnosis not present

## 2014-11-06 DIAGNOSIS — L97901 Non-pressure chronic ulcer of unspecified part of unspecified lower leg limited to breakdown of skin: Secondary | ICD-10-CM | POA: Diagnosis not present

## 2014-11-06 DIAGNOSIS — L97921 Non-pressure chronic ulcer of unspecified part of left lower leg limited to breakdown of skin: Secondary | ICD-10-CM | POA: Diagnosis not present

## 2014-11-06 DIAGNOSIS — L97821 Non-pressure chronic ulcer of other part of left lower leg limited to breakdown of skin: Secondary | ICD-10-CM | POA: Diagnosis not present

## 2014-11-11 DIAGNOSIS — L97821 Non-pressure chronic ulcer of other part of left lower leg limited to breakdown of skin: Secondary | ICD-10-CM | POA: Diagnosis not present

## 2014-11-11 DIAGNOSIS — L97921 Non-pressure chronic ulcer of unspecified part of left lower leg limited to breakdown of skin: Secondary | ICD-10-CM | POA: Diagnosis not present

## 2014-11-11 DIAGNOSIS — L97901 Non-pressure chronic ulcer of unspecified part of unspecified lower leg limited to breakdown of skin: Secondary | ICD-10-CM | POA: Diagnosis not present

## 2014-11-11 DIAGNOSIS — I87332 Chronic venous hypertension (idiopathic) with ulcer and inflammation of left lower extremity: Secondary | ICD-10-CM | POA: Diagnosis not present

## 2014-12-09 ENCOUNTER — Encounter (HOSPITAL_BASED_OUTPATIENT_CLINIC_OR_DEPARTMENT_OTHER): Payer: Medicare Other | Attending: Plastic Surgery

## 2014-12-09 DIAGNOSIS — R600 Localized edema: Secondary | ICD-10-CM | POA: Insufficient documentation

## 2014-12-09 DIAGNOSIS — L97819 Non-pressure chronic ulcer of other part of right lower leg with unspecified severity: Secondary | ICD-10-CM | POA: Diagnosis not present

## 2014-12-09 DIAGNOSIS — E11622 Type 2 diabetes mellitus with other skin ulcer: Secondary | ICD-10-CM | POA: Diagnosis not present

## 2014-12-09 DIAGNOSIS — L97821 Non-pressure chronic ulcer of other part of left lower leg limited to breakdown of skin: Secondary | ICD-10-CM | POA: Insufficient documentation

## 2014-12-09 DIAGNOSIS — I872 Venous insufficiency (chronic) (peripheral): Secondary | ICD-10-CM | POA: Insufficient documentation

## 2014-12-23 DIAGNOSIS — E11622 Type 2 diabetes mellitus with other skin ulcer: Secondary | ICD-10-CM | POA: Diagnosis not present

## 2014-12-23 DIAGNOSIS — I872 Venous insufficiency (chronic) (peripheral): Secondary | ICD-10-CM | POA: Diagnosis not present

## 2014-12-23 DIAGNOSIS — L97821 Non-pressure chronic ulcer of other part of left lower leg limited to breakdown of skin: Secondary | ICD-10-CM | POA: Diagnosis not present

## 2014-12-23 DIAGNOSIS — L97819 Non-pressure chronic ulcer of other part of right lower leg with unspecified severity: Secondary | ICD-10-CM | POA: Diagnosis not present

## 2015-01-13 ENCOUNTER — Encounter (HOSPITAL_BASED_OUTPATIENT_CLINIC_OR_DEPARTMENT_OTHER): Payer: Medicare Other | Attending: Plastic Surgery

## 2015-01-13 DIAGNOSIS — I83222 Varicose veins of left lower extremity with both ulcer of calf and inflammation: Secondary | ICD-10-CM | POA: Insufficient documentation

## 2015-01-13 DIAGNOSIS — I87332 Chronic venous hypertension (idiopathic) with ulcer and inflammation of left lower extremity: Secondary | ICD-10-CM | POA: Insufficient documentation

## 2015-01-13 DIAGNOSIS — I10 Essential (primary) hypertension: Secondary | ICD-10-CM | POA: Insufficient documentation

## 2015-01-13 DIAGNOSIS — E0842 Diabetes mellitus due to underlying condition with diabetic polyneuropathy: Secondary | ICD-10-CM | POA: Insufficient documentation

## 2015-01-13 DIAGNOSIS — B353 Tinea pedis: Secondary | ICD-10-CM | POA: Insufficient documentation

## 2015-01-13 DIAGNOSIS — M1990 Unspecified osteoarthritis, unspecified site: Secondary | ICD-10-CM | POA: Insufficient documentation

## 2015-01-13 DIAGNOSIS — I890 Lymphedema, not elsewhere classified: Secondary | ICD-10-CM | POA: Insufficient documentation

## 2015-01-20 DIAGNOSIS — M1990 Unspecified osteoarthritis, unspecified site: Secondary | ICD-10-CM | POA: Diagnosis not present

## 2015-01-20 DIAGNOSIS — I87332 Chronic venous hypertension (idiopathic) with ulcer and inflammation of left lower extremity: Secondary | ICD-10-CM | POA: Diagnosis not present

## 2015-01-20 DIAGNOSIS — B353 Tinea pedis: Secondary | ICD-10-CM | POA: Diagnosis not present

## 2015-01-20 DIAGNOSIS — E0842 Diabetes mellitus due to underlying condition with diabetic polyneuropathy: Secondary | ICD-10-CM | POA: Diagnosis not present

## 2015-01-20 DIAGNOSIS — I10 Essential (primary) hypertension: Secondary | ICD-10-CM | POA: Diagnosis not present

## 2015-01-20 DIAGNOSIS — I890 Lymphedema, not elsewhere classified: Secondary | ICD-10-CM | POA: Diagnosis not present

## 2015-01-20 DIAGNOSIS — I83222 Varicose veins of left lower extremity with both ulcer of calf and inflammation: Secondary | ICD-10-CM | POA: Diagnosis not present

## 2015-02-03 ENCOUNTER — Encounter (HOSPITAL_BASED_OUTPATIENT_CLINIC_OR_DEPARTMENT_OTHER): Payer: Medicare Other | Attending: Plastic Surgery

## 2015-02-03 DIAGNOSIS — L97221 Non-pressure chronic ulcer of left calf limited to breakdown of skin: Secondary | ICD-10-CM | POA: Insufficient documentation

## 2015-02-03 DIAGNOSIS — B353 Tinea pedis: Secondary | ICD-10-CM | POA: Insufficient documentation

## 2015-02-03 DIAGNOSIS — I890 Lymphedema, not elsewhere classified: Secondary | ICD-10-CM | POA: Insufficient documentation

## 2015-02-03 DIAGNOSIS — E1140 Type 2 diabetes mellitus with diabetic neuropathy, unspecified: Secondary | ICD-10-CM | POA: Insufficient documentation

## 2015-02-03 DIAGNOSIS — I83222 Varicose veins of left lower extremity with both ulcer of calf and inflammation: Secondary | ICD-10-CM | POA: Insufficient documentation

## 2015-02-03 DIAGNOSIS — L97211 Non-pressure chronic ulcer of right calf limited to breakdown of skin: Secondary | ICD-10-CM | POA: Diagnosis not present

## 2015-02-03 DIAGNOSIS — M1990 Unspecified osteoarthritis, unspecified site: Secondary | ICD-10-CM | POA: Diagnosis not present

## 2015-02-03 DIAGNOSIS — I87332 Chronic venous hypertension (idiopathic) with ulcer and inflammation of left lower extremity: Secondary | ICD-10-CM | POA: Diagnosis not present

## 2015-02-03 DIAGNOSIS — I10 Essential (primary) hypertension: Secondary | ICD-10-CM | POA: Diagnosis not present

## 2015-03-17 ENCOUNTER — Encounter (HOSPITAL_BASED_OUTPATIENT_CLINIC_OR_DEPARTMENT_OTHER): Payer: Medicare Other | Attending: Plastic Surgery

## 2015-03-17 DIAGNOSIS — E11622 Type 2 diabetes mellitus with other skin ulcer: Secondary | ICD-10-CM | POA: Diagnosis not present

## 2015-03-17 DIAGNOSIS — I83018 Varicose veins of right lower extremity with ulcer other part of lower leg: Secondary | ICD-10-CM | POA: Diagnosis not present

## 2015-03-17 DIAGNOSIS — I83028 Varicose veins of left lower extremity with ulcer other part of lower leg: Secondary | ICD-10-CM | POA: Diagnosis present

## 2015-03-17 DIAGNOSIS — I10 Essential (primary) hypertension: Secondary | ICD-10-CM | POA: Diagnosis not present

## 2015-03-17 DIAGNOSIS — L97821 Non-pressure chronic ulcer of other part of left lower leg limited to breakdown of skin: Secondary | ICD-10-CM | POA: Insufficient documentation

## 2015-03-17 DIAGNOSIS — L97811 Non-pressure chronic ulcer of other part of right lower leg limited to breakdown of skin: Secondary | ICD-10-CM | POA: Diagnosis not present

## 2015-03-17 DIAGNOSIS — E1140 Type 2 diabetes mellitus with diabetic neuropathy, unspecified: Secondary | ICD-10-CM | POA: Diagnosis not present

## 2015-03-17 DIAGNOSIS — M1990 Unspecified osteoarthritis, unspecified site: Secondary | ICD-10-CM | POA: Insufficient documentation

## 2015-04-07 ENCOUNTER — Encounter (HOSPITAL_BASED_OUTPATIENT_CLINIC_OR_DEPARTMENT_OTHER): Payer: Medicare Other | Attending: Plastic Surgery

## 2015-04-07 DIAGNOSIS — L97821 Non-pressure chronic ulcer of other part of left lower leg limited to breakdown of skin: Secondary | ICD-10-CM | POA: Insufficient documentation

## 2015-04-07 DIAGNOSIS — E11622 Type 2 diabetes mellitus with other skin ulcer: Secondary | ICD-10-CM | POA: Insufficient documentation

## 2015-04-07 DIAGNOSIS — I83222 Varicose veins of left lower extremity with both ulcer of calf and inflammation: Secondary | ICD-10-CM | POA: Insufficient documentation

## 2015-04-07 DIAGNOSIS — S91009A Unspecified open wound, unspecified ankle, initial encounter: Secondary | ICD-10-CM | POA: Insufficient documentation

## 2015-04-07 DIAGNOSIS — E1140 Type 2 diabetes mellitus with diabetic neuropathy, unspecified: Secondary | ICD-10-CM | POA: Insufficient documentation

## 2015-04-07 DIAGNOSIS — X58XXXA Exposure to other specified factors, initial encounter: Secondary | ICD-10-CM | POA: Insufficient documentation

## 2015-04-07 DIAGNOSIS — L97811 Non-pressure chronic ulcer of other part of right lower leg limited to breakdown of skin: Secondary | ICD-10-CM | POA: Insufficient documentation

## 2015-04-07 DIAGNOSIS — I10 Essential (primary) hypertension: Secondary | ICD-10-CM | POA: Insufficient documentation

## 2015-04-07 DIAGNOSIS — M1990 Unspecified osteoarthritis, unspecified site: Secondary | ICD-10-CM | POA: Insufficient documentation

## 2015-04-21 DIAGNOSIS — S91009A Unspecified open wound, unspecified ankle, initial encounter: Secondary | ICD-10-CM | POA: Diagnosis not present

## 2015-04-21 DIAGNOSIS — X58XXXA Exposure to other specified factors, initial encounter: Secondary | ICD-10-CM | POA: Diagnosis not present

## 2015-04-21 DIAGNOSIS — E11622 Type 2 diabetes mellitus with other skin ulcer: Secondary | ICD-10-CM | POA: Diagnosis present

## 2015-04-21 DIAGNOSIS — M1990 Unspecified osteoarthritis, unspecified site: Secondary | ICD-10-CM | POA: Diagnosis not present

## 2015-04-21 DIAGNOSIS — L97821 Non-pressure chronic ulcer of other part of left lower leg limited to breakdown of skin: Secondary | ICD-10-CM | POA: Diagnosis not present

## 2015-04-21 DIAGNOSIS — L97811 Non-pressure chronic ulcer of other part of right lower leg limited to breakdown of skin: Secondary | ICD-10-CM | POA: Diagnosis not present

## 2015-04-21 DIAGNOSIS — I83222 Varicose veins of left lower extremity with both ulcer of calf and inflammation: Secondary | ICD-10-CM | POA: Diagnosis not present

## 2015-04-21 DIAGNOSIS — E1140 Type 2 diabetes mellitus with diabetic neuropathy, unspecified: Secondary | ICD-10-CM | POA: Diagnosis not present

## 2015-04-21 DIAGNOSIS — I10 Essential (primary) hypertension: Secondary | ICD-10-CM | POA: Diagnosis not present

## 2015-05-05 DIAGNOSIS — L97821 Non-pressure chronic ulcer of other part of left lower leg limited to breakdown of skin: Secondary | ICD-10-CM | POA: Diagnosis not present

## 2015-05-05 DIAGNOSIS — E11622 Type 2 diabetes mellitus with other skin ulcer: Secondary | ICD-10-CM | POA: Diagnosis not present

## 2015-05-05 DIAGNOSIS — L97811 Non-pressure chronic ulcer of other part of right lower leg limited to breakdown of skin: Secondary | ICD-10-CM | POA: Diagnosis not present

## 2015-05-05 DIAGNOSIS — S91009A Unspecified open wound, unspecified ankle, initial encounter: Secondary | ICD-10-CM | POA: Diagnosis not present

## 2015-05-19 ENCOUNTER — Encounter (HOSPITAL_BASED_OUTPATIENT_CLINIC_OR_DEPARTMENT_OTHER): Payer: Medicare Other | Attending: Plastic Surgery

## 2015-05-19 DIAGNOSIS — E1142 Type 2 diabetes mellitus with diabetic polyneuropathy: Secondary | ICD-10-CM | POA: Insufficient documentation

## 2015-05-19 DIAGNOSIS — E11622 Type 2 diabetes mellitus with other skin ulcer: Secondary | ICD-10-CM | POA: Diagnosis not present

## 2015-05-19 DIAGNOSIS — I87332 Chronic venous hypertension (idiopathic) with ulcer and inflammation of left lower extremity: Secondary | ICD-10-CM | POA: Diagnosis not present

## 2015-05-19 DIAGNOSIS — B353 Tinea pedis: Secondary | ICD-10-CM | POA: Insufficient documentation

## 2015-05-19 DIAGNOSIS — I83228 Varicose veins of left lower extremity with both ulcer of other part of lower extremity and inflammation: Secondary | ICD-10-CM | POA: Diagnosis not present

## 2015-05-19 DIAGNOSIS — I890 Lymphedema, not elsewhere classified: Secondary | ICD-10-CM | POA: Diagnosis not present

## 2015-05-19 DIAGNOSIS — I10 Essential (primary) hypertension: Secondary | ICD-10-CM | POA: Diagnosis not present

## 2015-05-19 DIAGNOSIS — M1990 Unspecified osteoarthritis, unspecified site: Secondary | ICD-10-CM | POA: Insufficient documentation

## 2015-05-19 DIAGNOSIS — L97821 Non-pressure chronic ulcer of other part of left lower leg limited to breakdown of skin: Secondary | ICD-10-CM | POA: Insufficient documentation

## 2015-05-19 DIAGNOSIS — L97811 Non-pressure chronic ulcer of other part of right lower leg limited to breakdown of skin: Secondary | ICD-10-CM | POA: Insufficient documentation

## 2015-05-29 ENCOUNTER — Encounter (HOSPITAL_COMMUNITY): Payer: Self-pay | Admitting: Nurse Practitioner

## 2015-05-29 ENCOUNTER — Emergency Department (HOSPITAL_COMMUNITY)
Admission: EM | Admit: 2015-05-29 | Discharge: 2015-05-29 | Disposition: A | Discharge status: Home / Self Care | Payer: Medicare Other | Attending: Emergency Medicine | Admitting: Emergency Medicine

## 2015-05-29 ENCOUNTER — Emergency Department (HOSPITAL_COMMUNITY): Payer: Medicare Other

## 2015-05-29 DIAGNOSIS — Z87891 Personal history of nicotine dependence: Secondary | ICD-10-CM | POA: Diagnosis not present

## 2015-05-29 DIAGNOSIS — Z8673 Personal history of transient ischemic attack (TIA), and cerebral infarction without residual deficits: Secondary | ICD-10-CM | POA: Diagnosis not present

## 2015-05-29 DIAGNOSIS — I10 Essential (primary) hypertension: Secondary | ICD-10-CM | POA: Diagnosis not present

## 2015-05-29 DIAGNOSIS — Z79899 Other long term (current) drug therapy: Secondary | ICD-10-CM | POA: Insufficient documentation

## 2015-05-29 DIAGNOSIS — J449 Chronic obstructive pulmonary disease, unspecified: Secondary | ICD-10-CM | POA: Insufficient documentation

## 2015-05-29 DIAGNOSIS — Z8719 Personal history of other diseases of the digestive system: Secondary | ICD-10-CM | POA: Diagnosis not present

## 2015-05-29 DIAGNOSIS — M25552 Pain in left hip: Secondary | ICD-10-CM | POA: Diagnosis not present

## 2015-05-29 DIAGNOSIS — E785 Hyperlipidemia, unspecified: Secondary | ICD-10-CM | POA: Insufficient documentation

## 2015-05-29 DIAGNOSIS — R52 Pain, unspecified: Secondary | ICD-10-CM

## 2015-05-29 LAB — CBC WITH DIFFERENTIAL/PLATELET
Basophils Absolute: 0 10*3/uL (ref 0.0–0.1)
Basophils Relative: 0 %
Eosinophils Absolute: 0.2 10*3/uL (ref 0.0–0.7)
Eosinophils Relative: 2 %
HCT: 46.9 % (ref 39.0–52.0)
HEMOGLOBIN: 15 g/dL (ref 13.0–17.0)
LYMPHS ABS: 1.2 10*3/uL (ref 0.7–4.0)
LYMPHS PCT: 9 %
MCH: 31.1 pg (ref 26.0–34.0)
MCHC: 32 g/dL (ref 30.0–36.0)
MCV: 97.3 fL (ref 78.0–100.0)
Monocytes Absolute: 0.8 10*3/uL (ref 0.1–1.0)
Monocytes Relative: 6 %
NEUTROS ABS: 11.1 10*3/uL — AB (ref 1.7–7.7)
NEUTROS PCT: 83 %
PLATELETS: 241 10*3/uL (ref 150–400)
RBC: 4.82 MIL/uL (ref 4.22–5.81)
RDW: 13.8 % (ref 11.5–15.5)
WBC: 13.3 10*3/uL — AB (ref 4.0–10.5)

## 2015-05-29 LAB — I-STAT CHEM 8, ED
BUN: 29 mg/dL — ABNORMAL HIGH (ref 6–20)
CALCIUM ION: 1.14 mmol/L (ref 1.13–1.30)
Chloride: 105 mmol/L (ref 101–111)
Creatinine, Ser: 1.2 mg/dL (ref 0.61–1.24)
Glucose, Bld: 161 mg/dL — ABNORMAL HIGH (ref 65–99)
HEMATOCRIT: 49 % (ref 39.0–52.0)
HEMOGLOBIN: 16.7 g/dL (ref 13.0–17.0)
Potassium: 4.1 mmol/L (ref 3.5–5.1)
SODIUM: 141 mmol/L (ref 135–145)
TCO2: 22 mmol/L (ref 0–100)

## 2015-05-29 MED ORDER — HYDROCODONE-ACETAMINOPHEN 5-325 MG PO TABS
1.0000 | ORAL_TABLET | Freq: Once | ORAL | Status: AC
  Administered 2015-05-29: 1 via ORAL
  Filled 2015-05-29: qty 1

## 2015-05-29 MED ORDER — HYDROCODONE-ACETAMINOPHEN 5-325 MG PO TABS: 1.0000 | ORAL_TABLET | Freq: Four times a day (QID) | ORAL | Status: DC | PRN

## 2015-05-29 NOTE — ED Provider Notes (Signed)
CSN: [id number]     Arrival date & time 05/29/15  2005 History  By signing my name below, I, Budd Palmer, attest that this documentation has been prepared under the direction and in the presence of Arman Filter, NP. Electronically Signed: Budd Palmer, ED Scribe. 05/29/2015. 9:02 PM.    Chief Complaint  Patient presents with  . Leg Pain    Left leg   The history is provided by the patient. No language interpreter was used.   HPI Comments: Jay Walton is a 81 y.o. male with a PMHx of DM, HLD, HTN, and DJD, as well as a PSHx of total knee replacement brought in by ambulance, who presents to the Emergency Department complaining of constant, aching left leg pain from the left hip to the left knee, onset 3 days ago. He states this began as intermittent left knee pain, then worsened drastically tonight, when the pain began in the left hip. He reports exacerbation of the pain with walking. He states he has not taken anything for this. 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   Past Medical History  Diagnosis Date  . Hyperlipidemia   . Hypertension   . COPD (chronic obstructive pulmonary disease) (HCC)   . Vitamin D deficiency   . Aortic stenosis   . GERD (gastroesophageal reflux disease)   . History of TIAs   . Chronic cough   . DJD (degenerative joint disease)   . Edema   . Bronchitis    Past Surgical History  Procedure Laterality Date  . Replacement total knee    . Hernia repair    . Ankle surgery    . US echocardiography  06/14/2007    EF 55-60%   Family History  Problem Relation Age of Onset  . Pneumonia Mother   . Pneumonia Father    Social History  Substance Use Topics  . Smoking status: Former Smoker    Quit date: 05/12/1996  . Smokeless tobacco: None  . Alcohol  Use: No    Review of Systems  Respiratory: Negative for shortness of breath.   Genitourinary: Negative for dysuria.  Musculoskeletal: Positive for myalgias and arthralgias. Negative for joint swelling.  Skin: Positive for wound.  Neurological: Negative for weakness and numbness.  All other systems reviewed and are negative.   Allergies  Ace inhibitors and Esomeprazole magnesium  Home Medications   Prior to Admission medications   Medication Sig Start Date End Date Taking? Authorizing Provider  carvedilol (COREG) 6.25 MG tablet Take 6.25 mg by mouth 2 (two) times daily with a meal.      Historical Provider, MD  furosemide (LASIX) 20 MG tablet Take 20 mg by mouth daily. 07/24/14   Historical Provider, MD  losartan (COZAAR) 100 MG tablet Take 100 mg by mouth daily. 07/24/14 07/24/15  Historical Provider, MD  losartan-hydrochlorothiazide (HYZAAR) 100-25 MG per tablet Take 1 tablet by mouth daily.      Historical Provider, MD  rosuvastatin (CRESTOR) 20 MG tablet Take 20 mg by mouth daily. 07/24/14 07/24/15  Historical Provider, MD  simvastatin (ZOCOR) 40 MG tablet Take 80 mg by mouth at bedtime.     Historical Provider, MD  spironolactone (ALDACTONE) 25 MG tablet Take 25 mg by mouth 2 (two) times daily.  04/01/14   Historical Provider, MD   BP 165/82 mmHg  Pulse 94  Temp(Src) 98 F (36.7 C) (Oral)  Resp 20  SpO2 93% Physical Exam  Constitutional: He appears well-developed and well-nourished.  HENT:  Head: Normocephalic.  Eyes: Pupils are equal, round, and reactive to light.  Neck: Normal range of motion.  Cardiovascular: Normal rate.   Pulmonary/Chest: Effort normal.  Abdominal: Soft.  Musculoskeletal: He exhibits tenderness.       Left hip: He exhibits decreased range of motion. He exhibits no tenderness, no swelling and no deformity.  UNA boots not removed toes pink with brisk cap refill  Neurological: He is alert.  Skin: Skin is warm and dry.  Nursing note and vitals  reviewed.   ED Course  Procedures  DIAGNOSTIC STUDIES: Oxygen Saturation is 99% on RA, normal by my interpretation.    COORDINATION OF CARE: 8:24 PM - Discussed plans to wait on pt to disrobe to examine the leg. Pt advised of plan for treatment and pt agrees.  8:56 PM - Returned to examine pt's leg. Discussed plans to wait on diagnostic studies and imaging. Pt confirmed he received a dose of pain medication, as ordered in the ED. Pt advised of plan for treatment and pt agrees.  Labs Review Labs Reviewed  CBC WITH DIFFERENTIAL/PLATELET - Abnormal; Notable for the following:    WBC 13.3 (*)    Neutro Abs 11.1 (*)    All other components within normal limits  I-STAT CHEM 8, ED - Abnormal; Notable for the following:    BUN 29 (*)    Glucose, Bld 161 (*)    All other components within normal limits    Imaging Review Dg Hip Unilat With Pelvis 2-3 Views Left  05/29/2015  CLINICAL DATA:  Acute onset left hip and thigh pain. EXAM: DG HIP (WITH OR WITHOUT PELVIS) 2-3V LEFT COMPARISON:  None. FINDINGS: There is no evidence of hip fracture or dislocation. There is no evidence of arthropathy or other focal bone abnormality. No pelvic bone lesions identified. Mild peripheral vascular calcification noted. Lower lumbar spine degenerative changes are seen. IMPRESSION: No acute findings. Electronically Signed   By: Myles RosenthalJohn  Stahl M.D.   On: 05/29/2015 20:57   I have personally reviewed and evaluated these images and lab results as part of my medical decision-making.   EKG Interpretation None     Xray normal Patient was give PO Hydrocodone with relief of his pain Was ambulated with little discomfort  MDM   Final diagnoses:  Hip pain, acute, left   I personally performed the services described in this documentation, which was scribed in my presence. The recorded information has been reviewed and is accurate.  Earley FavorGail Taronda Comacho, NP 05/29/15 [id number]  Lavera Guiseana Duo Liu, MD 05/29/15 [phone]

## 2015-05-29 NOTE — ED Notes (Signed)
lpt ambulated in room with assistance complained of some hip pain and soreness. Rn notified

## 2015-05-29 NOTE — ED Notes (Signed)
Unable to collect labs at this time patient is in xray 

## 2015-05-29 NOTE — ED Notes (Signed)
Bed: WA20 Expected date: 05/29/15 Expected time: 7:55 PM Means of arrival: Ambulance Comments: 81 yo M  Leg pain

## 2015-05-29 NOTE — ED Notes (Signed)
Pt c/o left upper anterior thigh  Stating onset was sudden. Also remarks on diabetic leg ulcers, states his diabetes is "controlled via diet." CBG by medics was 223, also received 100 mcg IV Fentanyl en route.

## 2015-05-29 NOTE — Discharge Instructions (Signed)
Tonight your blood work and xray are normal You can safely take your pain medication on a regular basis for the next several days

## 2015-06-02 DIAGNOSIS — E11622 Type 2 diabetes mellitus with other skin ulcer: Secondary | ICD-10-CM | POA: Diagnosis not present

## 2015-06-02 DIAGNOSIS — I87332 Chronic venous hypertension (idiopathic) with ulcer and inflammation of left lower extremity: Secondary | ICD-10-CM | POA: Diagnosis not present

## 2015-06-02 DIAGNOSIS — L97821 Non-pressure chronic ulcer of other part of left lower leg limited to breakdown of skin: Secondary | ICD-10-CM | POA: Diagnosis not present

## 2015-06-02 DIAGNOSIS — I83228 Varicose veins of left lower extremity with both ulcer of other part of lower extremity and inflammation: Secondary | ICD-10-CM | POA: Diagnosis not present

## 2015-06-23 ENCOUNTER — Encounter (HOSPITAL_BASED_OUTPATIENT_CLINIC_OR_DEPARTMENT_OTHER): Payer: Medicare Other | Attending: Plastic Surgery

## 2015-06-23 DIAGNOSIS — E1142 Type 2 diabetes mellitus with diabetic polyneuropathy: Secondary | ICD-10-CM | POA: Insufficient documentation

## 2015-06-23 DIAGNOSIS — B353 Tinea pedis: Secondary | ICD-10-CM | POA: Diagnosis not present

## 2015-06-23 DIAGNOSIS — L97829 Non-pressure chronic ulcer of other part of left lower leg with unspecified severity: Secondary | ICD-10-CM | POA: Insufficient documentation

## 2015-06-23 DIAGNOSIS — I83228 Varicose veins of left lower extremity with both ulcer of other part of lower extremity and inflammation: Secondary | ICD-10-CM | POA: Diagnosis present

## 2015-06-23 DIAGNOSIS — I10 Essential (primary) hypertension: Secondary | ICD-10-CM | POA: Insufficient documentation

## 2015-06-23 DIAGNOSIS — I87332 Chronic venous hypertension (idiopathic) with ulcer and inflammation of left lower extremity: Secondary | ICD-10-CM | POA: Insufficient documentation

## 2015-06-23 DIAGNOSIS — I890 Lymphedema, not elsewhere classified: Secondary | ICD-10-CM | POA: Insufficient documentation

## 2015-06-23 DIAGNOSIS — M1990 Unspecified osteoarthritis, unspecified site: Secondary | ICD-10-CM | POA: Diagnosis not present

## 2015-07-15 ENCOUNTER — Encounter: Payer: Self-pay | Admitting: Neurology

## 2015-07-15 ENCOUNTER — Ambulatory Visit (INDEPENDENT_AMBULATORY_CARE_PROVIDER_SITE_OTHER): Payer: Medicare Other | Admitting: Neurology

## 2015-07-15 VITALS — BP 142/72 | HR 83 | Ht 67.25 in | Wt 247.0 lb

## 2015-07-15 DIAGNOSIS — R269 Unspecified abnormalities of gait and mobility: Secondary | ICD-10-CM | POA: Diagnosis not present

## 2015-07-15 DIAGNOSIS — R202 Paresthesia of skin: Secondary | ICD-10-CM | POA: Diagnosis not present

## 2015-07-15 DIAGNOSIS — E559 Vitamin D deficiency, unspecified: Secondary | ICD-10-CM | POA: Insufficient documentation

## 2015-07-15 NOTE — Progress Notes (Signed)
PATIENT: Jay Walton DOB: September 11, 1923  Chief Complaint  Patient presents with  . Peripheral Neuropathy    He is here with his son, [name]. He is having lack of sensation in both his hands and feet.  He is especially having more difficulty holding things, like a pen or his utensils.  He is also having swelling in both legs.  He just completed wound care 3-4 weeks ago.  Stated it took 11 months for his leg ulcers to heal.     HISTORICAL  Jay Walton is a 82 years old right-handed gentleman, accompanied by his son, seen in refer by  his endocrinologist Dr. Talmage NapBalan  for evaluation of bilateral hands and feet paresthesia in July 15 2015  He had a history of abnormal glucose, but not on any medication treatment, COPD, left knee replacement, hypertension  He had a gradual onset gait difficulty since 2013, began to use a cane, he had significant bilateral lower extremity swelling, nonhealing calf wound, require wound care  Since 2013, he also noticed gradual onset bilateral hands paresthesia, right worse than left, mainly involving first 3 fingers, initially it was intermittent, now it has become persistent, he denies significant weakness of both hands, he denies neck pain,  He has chronic low back pain, radiating pain to bilateral lower extremity, he denies bowel and bladder incontinence  I reviewed laboratory evaluation December 20 second 2016, normal CMP with exception of elevated glucose 1, creatinine was 1.2, vitamin B12 was 428, vitamin D was mildly low 21.4, normal TSH, normal CBC  REVIEW OF SYSTEMS: Full 14 system review of systems performed and notable only for swelling in legs, hearing loss, numbness, weakness, running nose  ALLERGIES: Allergies  Allergen Reactions  . Ace Inhibitors     COUGH  . Esomeprazole Magnesium Swelling    HOME MEDICATIONS: Current Outpatient Prescriptions  Medication Sig Dispense Refill  . Cholecalciferol (VITAMIN D PO) Take 2,000 Units by mouth  daily.     . furosemide (LASIX) 20 MG tablet Take 20 mg by mouth daily.    Marland Kitchen. losartan (COZAAR) 100 MG tablet Take 100 mg by mouth daily.     No current facility-administered medications for this visit.    PAST MEDICAL HISTORY: Past Medical History  Diagnosis Date  . Hyperlipidemia   . Hypertension   . COPD (chronic obstructive pulmonary disease) (HCC)   . Vitamin D deficiency   . Aortic stenosis   . GERD (gastroesophageal reflux disease)   . History of TIAs   . Chronic cough   . DJD (degenerative joint disease)   . Edema   . Bronchitis   . Peripheral neuropathy (HCC)   . Diabetes (HCC)     PAST SURGICAL HISTORY: Past Surgical History  Procedure Laterality Date  . Replacement total knee    . Hernia repair    . Ankle surgery    . [place] echocardiography  06/14/2007    EF 55-60%    FAMILY HISTORY: Family History  Problem Relation Age of Onset  . Pneumonia Mother   . Pneumonia Father     SOCIAL HISTORY:  Social History   Social History  . Marital Status: Married    Spouse Name: N/A  . Number of Children: 2  . Years of Education: 13   Occupational History  . Retired    Social History Main Topics  . Smoking status: Former Smoker    Quit date: 05/12/1996  . Smokeless tobacco: Not on file  .  Alcohol Use: No  . Drug Use: No  . Sexual Activity: Not on file   Other Topics Concern  . Not on file   Social History Narrative   Lives at home alone (son stays with him often).   Right-handed.   3-4 cups caffeine per day.     PHYSICAL EXAM   Filed Vitals:   07/15/15 1445  BP: 142/72  Pulse: 83  Height: 5' 7.25" (1.708 m)  Weight: 247 lb (112.038 kg)    Not recorded      Body mass index is 38.41 kg/(m^2).  PHYSICAL EXAMNIATION:  Gen: NAD, conversant, well nourised, obese, well groomed                     Cardiovascular: Regular rate rhythm, no peripheral edema, warm, nontender. Eyes: Conjunctivae clear without exudates or hemorrhage Neck: Supple,  no carotid bruise. Pulmonary: Clear to auscultation bilaterally   NEUROLOGICAL EXAM:  MENTAL STATUS: Speech:    Speech is normal; fluent and spontaneous with normal comprehension.  Cognition:     Orientation to time, place and person     Normal recent and remote memory     Normal Attention span and concentration     Normal Language, naming, repeating,spontaneous speech     Fund of knowledge   CRANIAL NERVES: CN II: Visual fields are full to confrontation. Fundoscopic exam is normal with sharp discs and no vascular changes. Pupils are round equal and briskly reactive to light. CN III, IV, VI: extraocular movement are normal. No ptosis. CN V: Facial sensation is intact to pinprick in all 3 divisions bilaterally. Corneal responses are intact.  CN VII: Face is symmetric with normal eye closure and smile. CN VIII: Hearing is normal to rubbing fingers CN IX, X: Palate elevates symmetrically. Phonation is normal. CN XI: Head turning and shoulder shrug are intact CN XII: Tongue is midline with normal movements and no atrophy.  MOTOR: There is no pronator drift of out-stretched arms. Muscle bulk and tone are normal. He has mild bilateral ankle dorsiflexion weakness  REFLEXES: Hypoactive and symmetric. Plantar responses are flexor.  SENSORY: Length dependent decreased to light touch, pinprick, and vibration sensation to midshin level  COORDINATION: Rapid alternating movements and fine finger movements are intact. There is no dysmetria on finger-to-nose and heel-knee-shin.    GAIT/STANCE: He needs push up to get up from seated position, unsteady, bilateral feet drop,   DIAGNOSTIC DATA (LABS, IMAGING, TESTING) - I reviewed patient records, labs, notes, testing and imaging myself where available.   ASSESSMENT AND PLAN  Jay Walton is a 82 y.o. male    Gait difficulty  Multifactorial, aging, deconditioning, shortness of breath upon exertion, he also has distal weakness, chronic  low back pain, likely indicating lumbar radiculopathy  MRI of lumbar  Home physical therapy  Bilateral hands paresthesia  Most consistent with carpal tunnel syndromes  EMG nerve conduction study  Wrist splint  Abnormal glucose    Levert Feinstein, M.D. Ph.D.  [facility] [address], [address] [place], [place] [address] Ph: [phone] Fax: [phone]  CC: Willow Ora, MD, Dorisann Frames, MD

## 2015-07-21 ENCOUNTER — Telehealth: Payer: Self-pay | Admitting: Neurology

## 2015-07-21 NOTE — Telephone Encounter (Signed)
[facility] [phone] called to request verbal orders for PT with frequency of 2 x week for 5 weeks, once for 2 weeks after that, to work on gait training and balance training.

## 2015-07-21 NOTE — Telephone Encounter (Signed)
Okay to give her daughter for physical therapy

## 2015-07-21 NOTE — Telephone Encounter (Signed)
[facility] and advised her that Dr. Terrace ArabiaYan approved PT with frequency of 2x week for 5 weeks, once for 2 weeks after initial 5 weeks, to work on gait training and balance training. Liji verbalized understanding.

## 2015-07-24 ENCOUNTER — Ambulatory Visit
Admission: RE | Admit: 2015-07-24 | Discharge: 2015-07-24 | Disposition: A | Discharge status: Home / Self Care | Payer: Medicare Other | Source: Ambulatory Visit | Attending: Neurology | Admitting: Neurology

## 2015-07-24 DIAGNOSIS — R269 Unspecified abnormalities of gait and mobility: Secondary | ICD-10-CM

## 2015-07-24 DIAGNOSIS — R202 Paresthesia of skin: Secondary | ICD-10-CM

## 2015-07-25 ENCOUNTER — Telehealth: Payer: Self-pay | Admitting: Neurology

## 2015-07-25 NOTE — Telephone Encounter (Signed)
I have called his son, MRI lumbar showed lumbar stenosis, most severe at L4-5 level, moderate at L3-4. EMG/NCS appt in Feb 21, will go over detail then  IMPRESSION: This is an abnormal MRI of the lumbar spine sign multilevel degenerative changes as detailed above. The most significant findings are: 1. Chronic compression fracture involving the inferior endplate of L1.. There is some bony intrusion of the vertebral body into the central canal but not enough to lead to spinal stenosis. 2. On sagittal images there is disc protrusion and facet hypertrophy at T10-T11 had appears to lead to bilateral foraminal narrowing and possible spinal stenosis. Consider MRI of the thoracic spine to further evaluate. 3. Degenerative changes at L1-L2 and L2-L3 did not lead to nerve root impingement. 4. At L3-L4 there is moderate spinal stenosis and probable left L4 nerve root compression due to disc herniation, endplate spurring, facet hypertrophy and ligamentum flavum hypertrophy. 5. At L4-L5, there is severe spinal stenosis due to anterolisthesis, disc protrusion, endplate spurring, facet hypertrophy and ligamentum flavum hypertrophy. There is probable bilateral L5 nerve root compression at this level. 6. At L5-S1 there is a right paramedian disc protrusion that displaces the right S1 nerve root

## 2015-08-26 ENCOUNTER — Ambulatory Visit (INDEPENDENT_AMBULATORY_CARE_PROVIDER_SITE_OTHER): Payer: Medicare Other | Admitting: Neurology

## 2015-08-26 DIAGNOSIS — M4806 Spinal stenosis, lumbar region: Secondary | ICD-10-CM

## 2015-08-26 DIAGNOSIS — M48061 Spinal stenosis, lumbar region without neurogenic claudication: Secondary | ICD-10-CM

## 2015-08-26 DIAGNOSIS — R202 Paresthesia of skin: Secondary | ICD-10-CM

## 2015-08-26 DIAGNOSIS — R269 Unspecified abnormalities of gait and mobility: Secondary | ICD-10-CM

## 2015-08-26 MED ORDER — GABAPENTIN 100 MG PO CAPS: ORAL_CAPSULE | ORAL | Status: DC

## 2015-08-26 NOTE — Progress Notes (Signed)
PATIENT: Jay Walton DOB: December 21, 1923  No chief complaint on file.    HISTORICAL  Jay Walton is a 82 years old right-handed gentleman, accompanied by his son, seen in refer by  his endocrinologist Dr. Talmage Nap  for evaluation of bilateral hands and feet paresthesia in July 15 2015  He had a history of abnormal glucose, but not on any medication treatment, COPD, left knee replacement, hypertension  He had a gradual onset gait difficulty since 2013, began to use a cane, he had significant bilateral lower extremity swelling, nonhealing calf wound, require wound care  Since 2013, he also noticed gradual onset bilateral hands paresthesia, right worse than left, mainly involving first 3 fingers, initially it was intermittent, now it has become persistent, he denies significant weakness of both hands, he denies neck pain,  He has chronic low back pain, radiating pain to bilateral lower extremity, he denies bowel and bladder incontinence  I reviewed laboratory evaluation December 20 second 2016, normal CMP with exception of elevated glucose 1, creatinine was 1.2, vitamin B12 was 428, vitamin D was mildly low 21.4, normal TSH, normal CBC  Update August 26 2015: He came in for electrodiagnostic study today, which consistent with mild axonal peripheral neuropathy, chronic bilateral sacral lumbar radiculopathy, severe bilateral carpal tunnel syndromes. Chronic left cervical radiculopathy.    we have reviewed MRI of lumbar spine, multilevel lumbar degenerative disc disease, evidence of chronic compression fracture involving the inferior endplate of L1.. There is some bony intrusion of the vertebral body into the central canal but not enough to lead to spinal stenosis. 2. On sagittal images there is disc protrusion and facet hypertrophy at T10-T11 had appears to lead to bilateral foraminal narrowing and possible spinal stenosis. Consider MRI of the thoracic spine to further evaluate. 3.  Degenerative changes at L1-L2 and L2-L3 did not lead to nerve root impingement. 4. At L3-L4 there is moderate spinal stenosis and probable left L4 nerve root compression due to disc herniation, endplate spurring, facet hypertrophy and ligamentum flavum hypertrophy. 5. At L4-L5, there is severe spinal stenosis due to anterolisthesis, disc protrusion, endplate spurring, facet hypertrophy and ligamentum flavum hypertrophy. There is probable bilateral L5 nerve root compression at this level. 6. At L5-S1 there is a right paramedian disc protrusion that displaces the right S1 nerve root  He denies bowel and bladder incontinence, does has bilateral lower extremity chronic cellulitis  REVIEW OF SYSTEMS: Full 14 system review of systems performed and notable only for swelling in legs, hearing loss, numbness, weakness, running nose  ALLERGIES: Allergies  Allergen Reactions  . Ace Inhibitors     COUGH  . Esomeprazole Magnesium Swelling    HOME MEDICATIONS: Current Outpatient Prescriptions  Medication Sig Dispense Refill  . Cholecalciferol (VITAMIN D PO) Take 2,000 Units by mouth daily.     . furosemide (LASIX) 20 MG tablet Take 20 mg by mouth daily.    Marland Kitchen gabapentin (NEURONTIN) 100 MG capsule 3-6 tablets every night 180 capsule 11  . losartan (COZAAR) 100 MG tablet Take 100 mg by mouth daily.     No current facility-administered medications for this visit.    PAST MEDICAL HISTORY: Past Medical History  Diagnosis Date  . Hyperlipidemia   . Hypertension   . COPD (chronic obstructive pulmonary disease) (HCC)   . Vitamin D deficiency   . Aortic stenosis   . GERD (gastroesophageal reflux disease)   . History of TIAs   . Chronic cough   . DJD (  degenerative joint disease)   . Edema   . Bronchitis   . Peripheral neuropathy (HCC)   . Diabetes (HCC)     PAST SURGICAL HISTORY: Past Surgical History  Procedure Laterality Date  . Replacement total knee    . Hernia repair    . Ankle  surgery    . [place] echocardiography  06/14/2007    EF 55-60%    FAMILY HISTORY: Family History  Problem Relation Age of Onset  . Pneumonia Mother   . Pneumonia Father     SOCIAL HISTORY:  Social History   Social History  . Marital Status: Married    Spouse Name: N/A  . Number of Children: 2  . Years of Education: 13   Occupational History  . Retired    Social History Main Topics  . Smoking status: Former Smoker    Quit date: 05/12/1996  . Smokeless tobacco: Not on file  . Alcohol Use: No  . Drug Use: No  . Sexual Activity: Not on file   Other Topics Concern  . Not on file   Social History Narrative   Lives at home alone (son stays with him often).   Right-handed.   3-4 cups caffeine per day.     PHYSICAL EXAM   There were no vitals filed for this visit.  Not recorded      There is no weight on file to calculate BMI.  PHYSICAL EXAMNIATION:  Gen: NAD, conversant, well nourised, obese, well groomed                     Cardiovascular: Regular rate rhythm, no peripheral edema, warm, nontender. Eyes: Conjunctivae clear without exudates or hemorrhage Neck: Supple, no carotid bruise. Pulmonary: Clear to auscultation bilaterally   NEUROLOGICAL EXAM:  MENTAL STATUS: Speech:    Speech is normal; fluent and spontaneous with normal comprehension.  Cognition:     Orientation to time, place and person     Normal recent and remote memory     Normal Attention span and concentration     Normal Language, naming, repeating,spontaneous speech     Fund of knowledge   CRANIAL NERVES: CN II: Visual fields are full to confrontation. Fundoscopic exam is normal with sharp discs and no vascular changes. Pupils are round equal and briskly reactive to light. CN III, IV, VI: extraocular movement are normal. No ptosis. CN V: Facial sensation is intact to pinprick in all 3 divisions bilaterally. Corneal responses are intact.  CN VII: Face is symmetric with normal eye closure  and smile. CN VIII: Hearing is normal to rubbing fingers CN IX, X: Palate elevates symmetrically. Phonation is normal. CN XI: Head turning and shoulder shrug are intact CN XII: Tongue is midline with normal movements and no atrophy.  MOTOR: There is no pronator drift of out-stretched arms. Muscle bulk and tone are normal. He has mild bilateral ankle dorsiflexion weakness, left worse than right   REFLEXES: Hypoactive and symmetric absent bilateral knee and ankle reflexes . Plantar responses are flexor.  SENSORY: Length dependent decreased to light touch, pinprick, and vibration sensation to midshin level  COORDINATION: Rapid alternating movements and fine finger movements are intact. There is no dysmetria on finger-to-nose and heel-knee-shin.    GAIT/STANCE: He needs push up to get up from seated position, unsteady, bilateral feet drop,Left worse than right    DIAGNOSTIC DATA (LABS, IMAGING, TESTING) - I reviewed patient records, labs, notes, testing and imaging myself where available.   ASSESSMENT  AND PLAN  CLARENCE COGSWELL is a 82 y.o. male    Gait difficulty  Multifactorial, aging, deconditioning, shortness of breath upon exertion,  Evidence of bilateral lumbar sacral radiculopathy  Chronic low back pain   I started gabapentin titrating to 100 mg 3 tablets 3 times a day  Bilateral hands paresthesia  Most consistent with  severe bilateral carpal tunnel syndromes which is confirmed by EMG nerve conduction study   continue wrist splint   When necessary NSAIDs     Levert Feinstein, M.D. Ph.D.  [facility] [address], [address] [place], [place] [address] Ph: [phone] Fax: [phone]  CC: Willow Ora, MD, Dorisann Frames, MD

## 2015-08-26 NOTE — Procedures (Signed)
   NCS (NERVE CONDUCTION STUDY) WITH EMG (ELECTROMYOGRAPHY) REPORT   STUDY DATE: August 26 2015 PATIENT NAME: Jay Walton DOB: [birth year] MRN: [id number]    TECHNOLOGIST: Gearldine Shown ELECTROMYOGRAPHER: Levert Feinstein M.D.  CLINICAL INFORMATION:  82 year old male with chronic low back pain, radiating pain to bilateral lower extremity, gradual onset gait difficulty, bilateral lower extremity chronic cellulitis.  FINDINGS: NERVE CONDUCTION STUDY: Bilateral peroneal sensory response was absent. Left peroneal, tibial motor responses were absent. Bilateral median sensory responses were absent. Bilateral ulnar sensory and motor responses were normal. Bilateral median motor responses were absent.  NEEDLE ELECTROMYOGRAPHY: Selective needle examinations was performed at bilateral lower extremity muscles, bilateral lumbar sacral paraspinal muscles, left upper extremity muscles, left cervical paraspinal muscles.  Bilateral tibialis anterior, medial gastrocnemius: Increased insertional activity, 1 plus spontaneous activity, complex motor unit potential, with mildly decreased recruitment patterns.  Bilateral vastus lateralis: Normal insertion activity, no spontaneous activity, mixture of normal, some enlarged motor unit potential, with mildly decreased recruitment patterns.  He has increased insertional activity at bilateral lumbar sacral paraspinal muscles, 1 plus spontaneous activity at bilateral L4-5 S1  Left first dorsal interossei, biceps, deltoid, triceps: Increased insertional activity, no spontaneous activity, mild enlargement motor unit potential, with mildly decreased recruitment patterns.  There was no spontaneous activity at left cervical paraspinal muscles, left C5, C6 and 7.  IMPRESSION:   This is an abnormal study. There is electrodiagnostic evidence of chronic bilateral lumbosacral radiculopathies, mainly involving bilateral L4-5 S1 myotomes. In addition, there is also evidence  of chronic left cervical radiculopathy, involving left C5-6 and 7 myotomes. There was evidence of severe bilateral carpal tunnel syndromes.  INTERPRETING PHYSICIAN:   Levert Feinstein M.D. Ph.D. [facility] [address] [place], [place] [address] [phone]

## 2015-09-07 ENCOUNTER — Emergency Department (HOSPITAL_COMMUNITY): Payer: Medicare Other

## 2015-09-07 ENCOUNTER — Emergency Department (HOSPITAL_COMMUNITY)
Admission: EM | Admit: 2015-09-07 | Discharge: 2015-09-07 | Disposition: A | Discharge status: Home / Self Care | Payer: Medicare Other | Attending: Emergency Medicine | Admitting: Emergency Medicine

## 2015-09-07 ENCOUNTER — Encounter (HOSPITAL_COMMUNITY): Payer: Self-pay | Admitting: Family Medicine

## 2015-09-07 DIAGNOSIS — S42291A Other displaced fracture of upper end of right humerus, initial encounter for closed fracture: Secondary | ICD-10-CM | POA: Diagnosis not present

## 2015-09-07 DIAGNOSIS — S43004A Unspecified dislocation of right shoulder joint, initial encounter: Secondary | ICD-10-CM

## 2015-09-07 DIAGNOSIS — Z79899 Other long term (current) drug therapy: Secondary | ICD-10-CM | POA: Diagnosis not present

## 2015-09-07 DIAGNOSIS — S43014A Anterior dislocation of right humerus, initial encounter: Secondary | ICD-10-CM | POA: Diagnosis not present

## 2015-09-07 DIAGNOSIS — E119 Type 2 diabetes mellitus without complications: Secondary | ICD-10-CM | POA: Diagnosis not present

## 2015-09-07 DIAGNOSIS — Y998 Other external cause status: Secondary | ICD-10-CM | POA: Diagnosis not present

## 2015-09-07 DIAGNOSIS — W010XXA Fall on same level from slipping, tripping and stumbling without subsequent striking against object, initial encounter: Secondary | ICD-10-CM | POA: Diagnosis not present

## 2015-09-07 DIAGNOSIS — J449 Chronic obstructive pulmonary disease, unspecified: Secondary | ICD-10-CM | POA: Diagnosis not present

## 2015-09-07 DIAGNOSIS — M1990 Unspecified osteoarthritis, unspecified site: Secondary | ICD-10-CM | POA: Insufficient documentation

## 2015-09-07 DIAGNOSIS — S4991XA Unspecified injury of right shoulder and upper arm, initial encounter: Secondary | ICD-10-CM | POA: Diagnosis present

## 2015-09-07 DIAGNOSIS — G629 Polyneuropathy, unspecified: Secondary | ICD-10-CM | POA: Insufficient documentation

## 2015-09-07 DIAGNOSIS — Y9301 Activity, walking, marching and hiking: Secondary | ICD-10-CM | POA: Insufficient documentation

## 2015-09-07 DIAGNOSIS — E559 Vitamin D deficiency, unspecified: Secondary | ICD-10-CM | POA: Diagnosis not present

## 2015-09-07 DIAGNOSIS — Z8673 Personal history of transient ischemic attack (TIA), and cerebral infarction without residual deficits: Secondary | ICD-10-CM | POA: Insufficient documentation

## 2015-09-07 DIAGNOSIS — Z87891 Personal history of nicotine dependence: Secondary | ICD-10-CM | POA: Insufficient documentation

## 2015-09-07 DIAGNOSIS — Y9289 Other specified places as the place of occurrence of the external cause: Secondary | ICD-10-CM | POA: Insufficient documentation

## 2015-09-07 DIAGNOSIS — I10 Essential (primary) hypertension: Secondary | ICD-10-CM | POA: Insufficient documentation

## 2015-09-07 MED ORDER — HYDROCODONE-ACETAMINOPHEN 5-325 MG PO TABS: 2.0000 | ORAL_TABLET | ORAL | Status: DC | PRN

## 2015-09-07 MED ORDER — FENTANYL CITRATE (PF) 100 MCG/2ML IJ SOLN
100.0000 ug | Freq: Once | INTRAMUSCULAR | Status: AC
  Administered 2015-09-07: 25 ug via INTRAVENOUS
  Filled 2015-09-07: qty 2

## 2015-09-07 MED ORDER — PROPOFOL 10 MG/ML IV BOLUS
100.0000 mg | Freq: Once | INTRAVENOUS | Status: AC
  Administered 2015-09-07: 60 mg via INTRAVENOUS
  Filled 2015-09-07: qty 20

## 2015-09-07 NOTE — Discharge Instructions (Signed)
How to Use a Shoulder Immobilizer A shoulder immobilizer is a device that you may have to wear after a shoulder injury or surgery. This device keeps your arm from moving. This prevents additional pain or injury. It also supports your arm next to your body as your shoulder heals. You may need to wear a shoulder immobilizer to treat a broken bone (fracture) in your shoulder. You may also need to wear one if you have an injury that moves your shoulder out of position (dislocation). There are different types of shoulder immobilizers. The one that you get depends on your injury. RISKS AND COMPLICATIONS Wearing a shoulder immobilizer in the wrong way can let your injured shoulder move around too much. This may delay healing and make your pain and swelling worse. HOW TO USE YOUR SHOULDER IMMOBILIZER  The part of the immobilizer that goes around your neck (sling) should support your upper arm, with your elbow bent and your lower arm and hand across your chest.  Make sure that your elbow:  Is snug against the back pocket of the sling.  Does not move away from your body.  The strap of the immobilizer should go over your shoulder and support your arm and hand. Your hand should be slightly higher than your elbow. It should not hang loosely over the edge of the sling.  If the long strap has a pad, place it where it is most comfortable on your neck.  Carefully follow your health care provider's instructions for wearing your shoulder immobilizer. Your health care provider may want you to:  Loosen your immobilizer to straighten your elbow and move your wrist and fingers. You may have to do this several times each day. Ask your health care provider when you should do this and how often.  Remove your immobilizer once every day to shower, but limit the movement in your injured arm. Before putting the immobilizer back on, use a towel to dry the area under your arm completely.  Remove your immobilizer to do  shoulder exercises at home as directed by your health care provider.  Wear your immobilizer while you sleep. You may sleep more comfortably if you have your upper body raised on pillows. SEEK MEDICAL CARE IF:  Your immobilizer is not supporting your arm properly.  Your immobilizer gets damaged.  You have worsening pain or swelling in your shoulder, arm, or hand.  Your shoulder, arm, or hand changes color or temperature.  You lose feeling in your shoulder, arm, or hand.   This information is not intended to replace advice given to you by your health care provider. Make sure you discuss any questions you have with your health care provider.   Document Released: 07/29/2004 Document Revised: 11/05/2014 Document Reviewed: 05/29/2014 Elsevier Interactive Patient Education 2016 [employer]  Humerus Fracture Treated With Immobilization The humerus is the large bone in the upper arm. A broken (fractured) humerus is often treated by wearing a cast, splint, or sling (immobilization). This holds the broken pieces in place so they can heal.  HOME CARE  Put ice on the injured area.  Put ice in a plastic bag.  Place a towel between your skin and the bag.  Leave the ice on for 15-20 minutes, 03-04 times a day.  If you are given a cast:  Do not scratch the skin under the cast.  Check the skin around the cast every day. You may put lotion on any red or sore areas.  Keep the cast  dry and clean.  If you are given a splint:  Wear the splint as told.  Keep the splint clean and dry.  Loosen the elastic around the splint if your fingers become numb, cold, tingle, or turn blue.  If you are given a sling:  Wear the sling as told.  Do not put pressure on any part of the cast or splint until it is fully hardened.  The cast or splint must be protected with a plastic bag during bathing. Do not lower the cast or splint into water.  Only take medicine as told by your doctor.  Do  exercises as told by your doctor.  Follow up as told by your doctor. GET HELP RIGHT AWAY IF:   Your skin or fingernails turn blue or gray.  Your arm feels cold or numb.  You have very bad pain in the injured arm.  You are having problems with the medicines you were given. MAKE SURE YOU:   Understand these instructions.  Will watch your condition.  Will get help right away if you are not doing well or get worse.   This information is not intended to replace advice given to you by your health care provider. Make sure you discuss any questions you have with your health care provider.   Document Released: 12/08/2007 Document Revised: 07/12/2014 Document Reviewed: 11/13/2014 Elsevier Interactive Patient Education 2016 [employer]  Shoulder Dislocation A shoulder dislocation happens when the upper arm bone (humerus) moves out of the shoulder joint. The shoulder joint is the part of the shoulder where the humerus, shoulder blade (scapula), and collarbone (clavicle) meet. CAUSES This condition is often caused by:  A fall.  A hit to the shoulder.  A forceful movement of the shoulder. RISK FACTORS This condition is more likely to develop in people who play sports. SYMPTOMS Symptoms of this condition include:  Deformity of the shoulder.  Intense pain.  Inability to move the shoulder.  Numbness, weakness, or tingling in your neck or down your arm.  Bruising or swelling around your shoulder. DIAGNOSIS This condition is diagnosed with a physical exam. After the exam, tests may be done to check for related problems. Tests that may be done include:  X-ray. This may be done to check for broken bones.  MRI. This may be done to check for damage to the tissues around the shoulder.  Electromyogram. This may be done to check for nerve damage. TREATMENT This condition is treated with a procedure to place the humerus back in the joint. This procedure is called a reduction. There  are two types of reduction:  Closed reduction. In this procedure, the humerus is placed back in the joint without surgery. The health care provider uses his or her hands to guide the bone back into place.  Open reduction. In this procedure, the humerus is placed back in the joint with surgery. An open reduction may be recommended if:  You have a weak shoulder joint or weak ligaments.  You have had more than one shoulder dislocation.  The nerves or blood vessels around your shoulder have been damaged. After the humerus is placed back into the joint, your arm will be placed in a splint or sling to prevent it from moving. You will need to wear the splint or sling until your shoulder heals. When the splint or sling is removed, you may have physical therapy to help improve the range of motion in your shoulder joint. HOME CARE INSTRUCTIONS If You Have  a Splint or Sling:  Wear it as told by your health care provider. Remove it only as told by your health care provider.  Loosen it if your fingers become numb and tingle, or if they turn cold and blue.  Keep it clean and dry. Bathing  Do not take baths, swim, or use a hot tub until your health care provider approves. Ask your health care provider if you can take showers. You may only be allowed to take sponge baths for bathing.  If your health care provider approves bathing and showering, cover your splint or sling with a watertight plastic bag to protect it from water. Do not let the splint or sling get wet. Managing Pain, Stiffness, and Swelling  If directed, apply ice to the injured area.  Put ice in a plastic bag.  Place a towel between your skin and the bag.  Leave the ice on for 20 minutes, 2-3 times per day.  Move your fingers often to avoid stiffness and to decrease swelling.  Raise (elevate) the injured area above the level of your heart while you are sitting or lying down. Driving  Do not drive while wearing a splint or sling  on a hand that you use for driving.  Do not drive or operate heavy machinery while taking pain medicine. Activity  Return to your normal activities as told by your health care provider. Ask your health care provider what activities are safe for you.  Perform range-of-motion exercises only as told by your health care provider.  Exercise your hand by squeezing a soft ball. This helps to decrease stiffness and swelling in your hand and wrist. General Instructions  Take over-the-counter and prescription medicines only as told by your health care provider.  Do not use any tobacco products, including cigarettes, chewing tobacco, or e-cigarettes. Tobacco can delay bone and tissue healing. If you need help quitting, ask your health care provider.  Keep all follow-up visits as told by your health care provider. This is important. SEEK MEDICAL CARE IF:  Your splint or sling gets damaged. SEEK IMMEDIATE MEDICAL CARE IF:  Your pain gets worse rather than better.  You lose feeling in your arm or hand.  Your arm or hand becomes white and cold.   This information is not intended to replace advice given to you by your health care provider. Make sure you discuss any questions you have with your health care provider.   Document Released: 03/16/2001 Document Revised: 03/12/2015 Document Reviewed: 10/14/2014 Elsevier Interactive Patient Education [employer] Else[employer] Inc[employer]

## 2015-09-07 NOTE — ED Notes (Signed)
[facility] Expected date: 09/07/15 Expected time: 6:45 PM Means of arrival: Ambulance Comments: fall

## 2015-09-07 NOTE — ED Notes (Signed)
[facility] Pt was walking to the kitchen, tripped over one of his cats, and fell. Pt's only complaint his right shoulder pain. EMS denies LOC, back or neck pain.

## 2015-09-07 NOTE — ED Provider Notes (Signed)
CSN: [id number]     Arrival date & time 09/07/15  1839 History   First MD Initiated Contact with Patient 09/07/15 1855     Chief Complaint  Patient presents with  . Fall  . Shoulder Pain      HPI [facility] is [facility] home and [facility] Pt was walking to the kitchen, tripped over one of his cats, and fell. Pt's only complaint his right shoulder pain. EMS denies LOC, back or neck pain Past Medical History  Diagnosis Date  . Hyperlipidemia   . Hypertension   . COPD (chronic obstructive pulmonary disease) (HCC)   . Vitamin D deficiency   . Aortic stenosis   . GERD (gastroesophageal reflux disease)   . History of TIAs   . Chronic cough   . DJD (degenerative joint disease)   . Edema   . Bronchitis   . Peripheral neuropathy (HCC)   . Diabetes [facility](HCC)    Past Surgical History  Procedure Laterality Date  . Replacement total knee    . Hernia repair    . Ankle surgery    . [place] echocardiography  06/14/2007    EF 55-60%   Family History  Problem Relation Age of Onset  . Pneumonia Mother   . Pneumonia Father    Social History  Substance Use Topics  . Smoking status: Former Smoker    Quit date: 05/12/1996  . Smokeless tobacco: None  . Alcohol Use: No    Review of Systems    Allergies  Ace inhibitors and Esomeprazole magnesium  Home Medications   Prior to Admission medications   Medication Sig Start Date End Date Taking? Authorizing Provider  Cholecalciferol (VITAMIN D PO) Take 2,000 Units by mouth daily.     Historical Provider, MD  furosemide (LASIX) 20 MG tablet Take 20 mg by mouth daily. 07/24/14   Historical Provider, MD  gabapentin (NEURONTIN) 100 MG capsule 3-6 tablets every night 08/26/15   Levert FeinsteinYijun Yan, MD  HYDROcodone-acetaminophen (NORCO/VICODIN) 5-325 MG tablet Take 2 tablets by mouth every 4 (four) hours as needed. 09/07/15   Nelva Nayobert Nancye Grumbine, MD  losartan (COZAAR) 100 MG tablet Take 100 mg by mouth daily. 07/24/14 07/24/15  Historical Provider, MD    BP 146/84 mmHg  Pulse 106  Temp(Src) 98.2 F (36.8 C) (Oral)  Resp 20  SpO2 96% Physical Exam  Constitutional: He is oriented to person, place, and time. He appears well-developed and well-nourished. No distress.  HENT:  Head: Normocephalic and atraumatic.  Eyes: Pupils are equal, round, and reactive to light.  Neck: Normal range of motion.  Cardiovascular: Normal rate and intact distal pulses.   Pulmonary/Chest: No respiratory distress.  Abdominal: Normal appearance. He exhibits no distension.  Musculoskeletal:       Right shoulder: He exhibits decreased range of motion, tenderness and bony tenderness.  Neurological: He is alert and oriented to person, place, and time. No cranial nerve deficit.  Skin: Skin is warm and dry. No rash noted.  Psychiatric: He has a normal mood and affect. His behavior is normal.  Nursing note and vitals reviewed.   ED Course  Reduction of dislocation Date/Time: 09/07/2015 8:51 PM Performed by: Nelva NayBEATON, Inayah Woodin Authorized by: Nelva NayBEATON, Analeigh Aries Consent: Verbal consent obtained. Written consent obtained. Consent given by: patient Patient identity confirmed: verbally with patient and arm band Local anesthesia used: no Patient sedated: yes Sedatives: propofol Analgesia: fentanyl Vitals: Vital signs were monitored during sedation. Patient tolerance: Patient tolerated the procedure well with no immediate  complications   (including critical care time)  Labs Review Labs Reviewed - No data to display  Imaging Review Dg Shoulder Right  09/07/2015  CLINICAL DATA:  Fall, severe right shoulder pain EXAM: RIGHT SHOULDER - 2+ VIEW COMPARISON:  None. FINDINGS: Anterior shoulder dislocation. Associated displaced Hill-Sachs fracture fragment involving the posterolateral humeral head. No definite fracture involving the glenoid rim. Mild degenerative changes of the acromioclavicular joint. Visualized right lung is clear. IMPRESSION: Anterior shoulder dislocation.  Associated fracture involving the posterolateral humeral head, mildly displaced. Electronically Signed   By: Charline Bills M.D.   On: 09/07/2015 19:25   Dg Shoulder Right Port  09/07/2015  CLINICAL DATA:  Right shoulder dislocation. Status post reduction. Initial encounter. EXAM: PORTABLE RIGHT SHOULDER - 2+ VIEW COMPARISON:  No prior today FINDINGS: Current study shows successful reduction of previously seen anterior shoulder dislocation. Prominent avulsion fracture fragment is seen along the posterior aspect of the humeral head. Moderate acromioclavicular DJD also noted. IMPRESSION: Successful reduction of previously seen anterior shoulder dislocation. Prominent avulsion fracture fragment along the posterior humeral head. Electronically Signed   By: Myles Rosenthal M.D.   On: 09/07/2015 20:35   Dg Shoulder Right Port  09/07/2015  CLINICAL DATA:  Right shoulder dislocation. Status postreduction. Initial encounter. EXAM: PORTABLE RIGHT SHOULDER - 2+ VIEW COMPARISON:  09/07/2015 FINDINGS: Persistent anterior dislocation of the humeral head is seen. A fracture fragment is again likely arising from the posterior aspect of the humeral head. Moderate acromioclavicular DJD again noted. IMPRESSION: Persistent anterior dislocation of the humeral head. Fracture fragment again seen likely arising from the posterior humeral head. Electronically Signed   By: Myles Rosenthal M.D.   On: 09/07/2015 20:21   I have personally reviewed and evaluated these images and lab results as part of my medical decision-making.    MDM   Final diagnoses:  Shoulder dislocation, right, initial encounter  Humeral head fracture, right, closed, initial encounter        Nelva Nay, MD 09/07/15 2107

## 2015-09-07 NOTE — ED Notes (Signed)
Pt in xray

## 2015-11-24 ENCOUNTER — Encounter: Payer: Self-pay | Admitting: Neurology

## 2015-11-24 ENCOUNTER — Ambulatory Visit (INDEPENDENT_AMBULATORY_CARE_PROVIDER_SITE_OTHER): Payer: Medicare Other | Admitting: Neurology

## 2015-11-24 VITALS — BP 136/67 | HR 87 | Ht 67.25 in | Wt 242.2 lb

## 2015-11-24 DIAGNOSIS — M4806 Spinal stenosis, lumbar region: Secondary | ICD-10-CM | POA: Diagnosis not present

## 2015-11-24 DIAGNOSIS — E559 Vitamin D deficiency, unspecified: Secondary | ICD-10-CM | POA: Diagnosis not present

## 2015-11-24 DIAGNOSIS — R202 Paresthesia of skin: Secondary | ICD-10-CM

## 2015-11-24 DIAGNOSIS — R269 Unspecified abnormalities of gait and mobility: Secondary | ICD-10-CM | POA: Diagnosis not present

## 2015-11-24 DIAGNOSIS — M48061 Spinal stenosis, lumbar region without neurogenic claudication: Secondary | ICD-10-CM

## 2015-11-24 MED ORDER — GABAPENTIN 300 MG PO CAPS: ORAL_CAPSULE | ORAL | Status: AC

## 2015-11-24 NOTE — Progress Notes (Signed)
Chief Complaint  Patient presents with  . Gait Difficulty    He is here with his son, Jay Walton.  They would like to further discuss his MRI.  He has completed PT and felt it was helpful for his gait.  He did have a fall on 09/07/15 that resulted in a dislocated right shoulder.   . Hand Paresthesia    They would like to review his NCV/EMG results.  He is wearing bilateral wrist splints now that have improved his symptoms.       PATIENT: Jay Walton DOB: 08/07/1923  Chief Complaint  Patient presents with  . Gait Difficulty    He is here with his son, Jay Walton.  They would like to further discuss his MRI.  He has completed PT and felt it was helpful for his gait.  He did have a fall on 09/07/15 that resulted in a dislocated right shoulder.   . Hand Paresthesia    They would like to review his NCV/EMG results.  He is wearing bilateral wrist splints now that have improved his symptoms.      HISTORICAL  Jay Walton is a 82 years old right-handed gentleman, accompanied by his son, seen in refer by  his endocrinologist Dr. Talmage Nap  for evaluation of bilateral hands and feet paresthesia in July 15 2015  He had a history of abnormal glucose, but not on any medication treatment, COPD, left knee replacement, hypertension  He had a gradual onset gait difficulty since 2013, began to use a cane, he had significant bilateral lower extremity swelling, nonhealing calf wound, require wound care  Since 2013, he also noticed gradual onset bilateral hands paresthesia, right worse than left, mainly involving first 3 fingers, initially it was intermittent, now it has become persistent, he denies significant weakness of both hands, he denies neck pain,  He has chronic low back pain, radiating pain to bilateral lower extremity, he denies bowel and bladder incontinence  I reviewed laboratory evaluation December 20 second 2016, normal CMP with exception of elevated glucose 1, creatinine was 1.2, vitamin B12 was 428,  vitamin D was mildly low 21.4, normal TSH, normal CBC  Update August 26 2015: He came in for electrodiagnostic study today, which consistent with mild axonal peripheral neuropathy, chronic bilateral sacral lumbar radiculopathy, severe bilateral carpal tunnel syndromes. Chronic left cervical radiculopathy.    we have reviewed MRI of lumbar spine, multilevel lumbar degenerative disc disease, evidence of chronic compression fracture involving the inferior endplate of L1.. There is some bony intrusion of the vertebral body into the central canal but not enough to lead to spinal stenosis. 2. On sagittal images there is disc protrusion and facet hypertrophy at T10-T11 had appears to lead to bilateral foraminal narrowing and possible spinal stenosis. Consider MRI of the thoracic spine to further evaluate. 3. Degenerative changes at L1-L2 and L2-L3 did not lead to nerve root impingement. 4. At L3-L4 there is moderate spinal stenosis and probable left L4 nerve root compression due to disc herniation, endplate spurring, facet hypertrophy and ligamentum flavum hypertrophy. 5. At L4-L5, there is severe spinal stenosis due to anterolisthesis, disc protrusion, endplate spurring, facet hypertrophy and ligamentum flavum hypertrophy. There is probable bilateral L5 nerve root compression at this level. 6. At L5-S1 there is a right paramedian disc protrusion that displaces the right S1 nerve root  He denies bowel and bladder incontinence, does has bilateral lower extremity chronic cellulitis  Update Nov 24 2015: His accompanied by his son Jay Walton at today's  clinical visit, he continue have significant gait difficulty, only used 1 foot cane, bilateral feet paresthesia  We have again reviewed EMG nerve conduction study in February 2017, there is evidence of axonal peripheral neuropathy consistent with his diabetes, chronic bilateral lumbar sacral radiculopathy, severe bilateral carpal tunnel syndromes, chronic  left cervical radiculopathy  We also personally reviewed MRI of lumbar spine in January 2017, multilevel degenerative disc disease, L1 compression fracture,severe spinal stenosis at L4-5, moderate stenosis at L 3-4  He has significant low back pain, radiating pain to bilateral lower extremity, no bowel and bladder incontinence  REVIEW OF SYSTEMS: Full 14 system review of systems performed and notable only for gait abnormality, numbness, weakness, joint pain, no back pain, walking difficulty, leg swelling, cough, wheezing shortness of breath hearing loss, trouble swallowing runny nose  ALLERGIES: Allergies  Allergen Reactions  . Ace Inhibitors     COUGH  . Esomeprazole Magnesium Swelling    HOME MEDICATIONS: Current Outpatient Prescriptions  Medication Sig Dispense Refill  . Cholecalciferol (VITAMIN D PO) Take 2,000 Units by mouth daily.     . furosemide (LASIX) 20 MG tablet Take 20 mg by mouth daily.    Marland Kitchen gabapentin (NEURONTIN) 100 MG capsule 3-6 tablets every night 180 capsule 11  . losartan (COZAAR) 100 MG tablet Take 100 mg by mouth daily.     No current facility-administered medications for this visit.    PAST MEDICAL HISTORY: Past Medical History  Diagnosis Date  . Hyperlipidemia   . Hypertension   . COPD (chronic obstructive pulmonary disease) (HCC)   . Vitamin D deficiency   . Aortic stenosis   . GERD (gastroesophageal reflux disease)   . History of TIAs   . Chronic cough   . DJD (degenerative joint disease)   . Edema   . Bronchitis   . Peripheral neuropathy (HCC)   . Diabetes (HCC)     PAST SURGICAL HISTORY: Past Surgical History  Procedure Laterality Date  . Replacement total knee    . Hernia repair    . Ankle surgery    . US echocardiography  06/14/2007    EF 55-60%    FAMILY HISTORY: Family History  Problem Relation Age of Onset  . Pneumonia Mother   . Pneumonia Father     SOCIAL HISTORY:  Social History   Social History  . Marital Status:  Married    Spouse Name: N/A  . Number of Children: 2  . Years of Education: 13   Occupational History  . Retired    Social History Main Topics  . Smoking status: Former Smoker    Quit date: 05/12/1996  . Smokeless tobacco: Not on file  . Alcohol Use: No  . Drug Use: No  . Sexual Activity: Not on file   Other Topics Concern  . Not on file   Social History Narrative   Lives at home alone (son stays with him often).   Right-handed.   3-4 cups caffeine per day.     PHYSICAL EXAM   Filed Vitals:   11/24/15 0912  BP: 136/67  Pulse: 87  Height: 5' 7.25" (1.708 m)  Weight: 242 lb 4 oz (109.884 kg)    Not recorded      Body mass index is 37.67 kg/(m^2).  PHYSICAL EXAMNIATION:  Gen: NAD, conversant, well nourised, obese, well groomed                     Cardiovascular: Regular rate rhythm, no peripheral  edema, warm, nontender. Eyes: Conjunctivae clear without exudates or hemorrhage Neck: Supple, no carotid bruise. Pulmonary: Clear to auscultation bilaterally   NEUROLOGICAL EXAM:  MENTAL STATUS:Unkempt Speech:    Speech is normal; fluent and spontaneous with normal comprehension.  Cognition:     Orientation to time, place and person     Normal recent and remote memory     Normal Attention span and concentration     Normal Language, naming, repeating,spontaneous speech     Fund of knowledge   CRANIAL NERVES: CN II: Visual fields are full to confrontation. Fundoscopic exam is normal with sharp discs and no vascular changes. Pupils are round equal and briskly reactive to light. CN III, IV, VI: extraocular movement are normal. No ptosis. CN V: Facial sensation is intact to pinprick in all 3 divisions bilaterally. Corneal responses are intact.  CN VII: Face is symmetric with normal eye closure and smile. CN VIII: Hearing is normal to rubbing fingers CN IX, X: Palate elevates symmetrically. Phonation is normal. CN XI: Head turning and shoulder shrug are intact CN  XII: Tongue is midline with normal movements and no atrophy.  MOTOR: There is no pronator drift of out-stretched arms. Muscle bulk and tone are normal. He has mild bilateral ankle dorsiflexion weakness, left worse than right   REFLEXES: Hypoactive and symmetric absent bilateral knee and ankle reflexes . Plantar responses are flexor.  SENSORY: Length dependent decreased to light touch, pinprick, and vibration sensation to midshin level  COORDINATION: Rapid alternating movements and fine finger movements are intact. There is no dysmetria on finger-to-nose and heel-knee-shin.    GAIT/STANCE: He needs push up to get up from seated position, unsteady, bilateral feet drop,Left worse than right    DIAGNOSTIC DATA (LABS, IMAGING, TESTING) - I reviewed patient records, labs, notes, testing and imaging myself where available.   ASSESSMENT AND PLAN  Jay Walton is a 82 y.o. male    Gait difficulty  Multifactorial, aging, deconditioning, shortness of breath upon exertion, bilateral lumbar sacral radiculopathy, diabetic peripheral neuropathy  Refer him to physical therapy  Bedside commode, 4 foot cane prescription was provided Chronic low back pain   Increase gabapentin to 300/300/600     Levert FeinsteinYijun Wilmary Levit, M.D. Ph.D.  [facility] [address], [address] [place], [place] [address] Ph: [phone] Fax: [phone]  CC: Willow Oraamille L Andy, MD, Dorisann FramesBindubal Balan, MD

## 2015-11-25 ENCOUNTER — Telehealth: Payer: Self-pay | Admitting: Neurology

## 2015-11-25 LAB — COMPREHENSIVE METABOLIC PANEL
A/G RATIO: 1.2 (ref 1.2–2.2)
ALT: 16 IU/L (ref 0–44)
AST: 17 IU/L (ref 0–40)
Albumin: 3.7 g/dL (ref 3.2–4.6)
Alkaline Phosphatase: 90 IU/L (ref 39–117)
BUN/Creatinine Ratio: 25 — ABNORMAL HIGH (ref 10–24)
BUN: 24 mg/dL (ref 10–36)
Bilirubin Total: 0.3 mg/dL (ref 0.0–1.2)
CALCIUM: 8.9 mg/dL (ref 8.6–10.2)
CHLORIDE: 102 mmol/L (ref 96–106)
CO2: 23 mmol/L (ref 18–29)
Creatinine, Ser: 0.97 mg/dL (ref 0.76–1.27)
GFR calc Af Amer: 79 mL/min/{1.73_m2} (ref >59)
GFR, EST NON AFRICAN AMERICAN: 68 mL/min/{1.73_m2} (ref >59)
GLOBULIN, TOTAL: 3 g/dL (ref 1.5–4.5)
Glucose: 121 mg/dL — ABNORMAL HIGH (ref 65–99)
POTASSIUM: 4.4 mmol/L (ref 3.5–5.2)
SODIUM: 142 mmol/L (ref 134–144)
Total Protein: 6.7 g/dL (ref 6.0–8.5)

## 2015-11-25 LAB — CBC
HEMATOCRIT: 43.2 % (ref 37.5–51.0)
Hemoglobin: 13.9 g/dL (ref 12.6–17.7)
MCH: 30 pg (ref 26.6–33.0)
MCHC: 32.2 g/dL (ref 31.5–35.7)
MCV: 93 fL (ref 79–97)
Platelets: 229 10*3/uL (ref 150–379)
RBC: 4.64 x10E6/uL (ref 4.14–5.80)
RDW: 14.5 % (ref 12.3–15.4)
WBC: 8.2 10*3/uL (ref 3.4–10.8)

## 2015-11-25 LAB — TSH: TSH: 1.25 u[IU]/mL (ref 0.450–4.500)

## 2015-11-25 LAB — RPR: RPR: NONREACTIVE

## 2015-11-25 LAB — HGB A1C W/O EAG: Hgb A1c MFr Bld: 6.4 % — ABNORMAL HIGH (ref 4.8–5.6)

## 2015-11-25 LAB — CK: Total CK: 61 U/L (ref 24–204)

## 2015-11-25 LAB — VITAMIN B12: VITAMIN B 12: 342 pg/mL (ref 211–946)

## 2015-11-25 NOTE — Telephone Encounter (Signed)
Please call his son, laboratory evaluation showed A1c 6.4, consistent with his diagnosis of diabetes, mild elevated glucose 120, rest of the laboratory evaluation was normal

## 2015-11-25 NOTE — Telephone Encounter (Signed)
Spoke to DoraRoss (son on HIP[employer]) - he is aware of results.

## 2016-01-14 ENCOUNTER — Encounter (INDEPENDENT_AMBULATORY_CARE_PROVIDER_SITE_OTHER): Payer: Self-pay

## 2016-01-14 ENCOUNTER — Ambulatory Visit (INDEPENDENT_AMBULATORY_CARE_PROVIDER_SITE_OTHER): Payer: Medicare Other | Admitting: Pulmonary Disease

## 2016-01-14 ENCOUNTER — Encounter: Payer: Self-pay | Admitting: Cardiovascular Disease

## 2016-01-14 ENCOUNTER — Ambulatory Visit (INDEPENDENT_AMBULATORY_CARE_PROVIDER_SITE_OTHER): Payer: Medicare Other | Admitting: Cardiovascular Disease

## 2016-01-14 ENCOUNTER — Encounter: Payer: Self-pay | Admitting: Pulmonary Disease

## 2016-01-14 VITALS — BP 160/88 | HR 86 | Ht 67.25 in | Wt 243.0 lb

## 2016-01-14 VITALS — BP 140/90 | HR 87 | Ht 67.25 in | Wt 243.8 lb

## 2016-01-14 DIAGNOSIS — R0600 Dyspnea, unspecified: Secondary | ICD-10-CM

## 2016-01-14 DIAGNOSIS — I350 Nonrheumatic aortic (valve) stenosis: Secondary | ICD-10-CM | POA: Diagnosis not present

## 2016-01-14 DIAGNOSIS — J449 Chronic obstructive pulmonary disease, unspecified: Secondary | ICD-10-CM | POA: Diagnosis not present

## 2016-01-14 MED ORDER — ALBUTEROL SULFATE HFA 108 (90 BASE) MCG/ACT IN AERS: 2.0000 | INHALATION_SPRAY | Freq: Four times a day (QID) | RESPIRATORY_TRACT | Status: AC | PRN

## 2016-01-14 MED ORDER — FLUTICASONE FUROATE-VILANTEROL 100-25 MCG/INH IN AEPB: 1.0000 | INHALATION_SPRAY | Freq: Every day | RESPIRATORY_TRACT | Status: AC

## 2016-01-14 NOTE — Progress Notes (Signed)
Jay Walton Date of Birth  1923-08-30 [facility] [address]    [address] [place], [place]  [address] [phone]  Fax  [phone]  Problem list 1. Aortic stenosis-mild 2. Hyperlipidemia 3. COPD  Jay RuizJohn is an 82 year old gentleman with a history of mild aortic stenosis, hypercholesterolemia, and hypertension. He also has a history of COPD.  He's done fairly well. He's been on all of his medications. He has been under some stress today so his blood pressure is a little elevated   01/14/2016:   Jay RuizJohn is seen today after a 5 year absence.  Seen with his son , Jay RuizJohn . He seen for preoperative evaluation prior to having see older surgery. He has a history of mild aortic stenosis. Echocardiogram performed February, 2016 reveals heavily calcified aortic valve but with only mild aortic stenosis. He has normal left ventricle systolic function with an EF of 55-60%.  Has COPD so he is short of breath frequently .  No cp No regular exercise .  Does OK with household chores.  No PND or orthopnea.   No syncope episodes     Current Outpatient Prescriptions on File Prior to Visit  Medication Sig Dispense Refill  . Cholecalciferol (VITAMIN D PO) Take 2,000 Units by mouth daily.     Marland Kitchen. gabapentin (NEURONTIN) 300 MG capsule One po twice during the day, 1-2 tabs at night 120 capsule 11  . losartan (COZAAR) 100 MG tablet Take 100 mg by mouth daily.    . furosemide (LASIX) 20 MG tablet Take 20 mg by mouth daily. Reported on 01/14/2016     No current facility-administered medications on file prior to visit.    Allergies  Allergen Reactions  . Ace Inhibitors     COUGH  . Esomeprazole Magnesium Swelling    Past Medical History  Diagnosis Date  . Hyperlipidemia   . Hypertension   . COPD (chronic obstructive pulmonary disease) (HCC)   . Vitamin D deficiency   . Aortic stenosis   . GERD (gastroesophageal reflux disease)   . History of TIAs   . Chronic cough   . DJD  (degenerative joint disease)   . Edema   . Bronchitis   . Peripheral neuropathy (HCC)   . Diabetes [facility](HCC)     Past Surgical History  Procedure Laterality Date  . Replacement total knee    . Hernia repair    . Ankle surgery    . [place] echocardiography  06/14/2007    EF 55-60%    History  Smoking status  . Former Smoker  . Quit date: 05/12/1996  Smokeless tobacco  . Not on file    History  Alcohol Use No    Family History  Problem Relation Age of Onset  . Pneumonia Mother   . Pneumonia Father     Reviw of Systems:  Reviewed in the HPI.  All other systems are negative.  Physical Exam: BP 140/90 mmHg  Pulse 87  Ht 5' 7.25" (1.708 m)  Wt 243 lb 12.8 oz (110.587 kg)  BMI 37.91 kg/m2 The patient is alert and oriented x 3.  The mood and affect are normal.   Skin: warm and dry.  Color is normal.    HEENT:   [place]/AT, normal carotids, no JVD  Lungs: clear   Heart: RR, soft systolic murmur    Abdomen: + BS, mild obesity  Extremities:  No c/c/ He has 3+ pitting edema with chronic stasis changes.   Neuro:  Non focal  ECG: January 14, 2016:    Normal sinus rhythm at 87 .    Assessment / Plan:   1. Preoperative evaluation: Shermon is doing well. He's not been seen here in 5 years. He has mild aortic stenosis but has no symptoms related to aortic stenosis. He does have shortness of breath related to his COPD. He had an echo card gram last year that showed normal left ventricle systolic function and mild aortic stenosis.  He has good pulse pressures in his radial arteries indicating that his aortic stenosis has not worsened significantly.  I would put him at low to moderate risk for cardiovascular consultations. His heart is actually in great shape but he is a 82 year old patient being considered for elective shoulder surgery.    2. Venous insufficiency: The patient has significant leg edema. This is not related to cardiac [place] Route is far as I can see. He does have COPD and  may have some pulmonary hypertension. He certainly has some degree of venous insufficiency.  He has a Nutritional therapist Leg rest which he uses on occasion. I've recommended that he uses on a daily basis-several times a day. He should use Ace wraps or compression hose      Kristeen Miss, MD  01/14/2016 3:21 PM    [facility] [address] [place],  [address] [place], [place]  [address] Pager [phone] Phone: [phone]; Fax: [phone]

## 2016-01-14 NOTE — Patient Instructions (Addendum)
It is nice to meet you today You would most likely benefit from some treatment for your COPD. We can add Breo 1 puff once daily We will instruct you on use today. Rinse mouth after use We will prescribe you with a rescue inhaler for use with wheezing or shortness of breath up to 4 times daily as needed.( Ventolin) Spirometry in the office today. We will clear your for surgery . You are at low to moderate risk for pulmonary complications. Follow up with Dr. Isaiah SergeMannam in 3 months with full PFT's prior. Please contact office for sooner follow up if symptoms do not improve or worsen or seek emergency care

## 2016-01-14 NOTE — Assessment & Plan Note (Addendum)
COPD for surgical clearance for shoulder surgery Currently not followed by pulmonology Currently not on any pulmonary medications Risk for pulmonary complications is low to moderate. Plan: You would most likely benefit from some treatment for your COPD. We can add Breo 1 puff once daily We will instruct you on use today. Rinse mouth after use We will prescribe you with a rescue inhaler for use with wheezing or shortness of breath up to 4 times daily as needed.( Ventolin) Spirometry in the office today. We will clear your for surgery . You are at low to moderate risk for pulmonary complications. Follow up with Dr. Isaiah SergeMannam in 3 months with full PFT's prior. Please contact office for sooner follow up if symptoms do not improve or worsen or seek emergency care   Post Op Treatment should include special attention to pulmonary toilet:  Major Pulmonary risks identified in the multifactorial risk analysis are but not limited to a) pneumonia; b) recurrent intubation risk; c) prolonged or recurrent acute respiratory failure needing mechanical ventilation; d) prolonged hospitalization; e) DVT/Pulmonary embolism; f) Acute Pulmonary edema  Recommend 1. Short duration of surgery as much as possible and avoid paralytic if possible 2. Recovery in step down or ICU with Pulmonary consultation 3. DVT prophylaxis 4. Aggressive pulmonary toilet with o2, bronchodilatation, and incentive spirometry and early ambulation

## 2016-01-14 NOTE — Patient Instructions (Signed)

## 2016-01-14 NOTE — Progress Notes (Signed)
History of Present Illness Jay Walton is a 82 y.o. male  Former smoker ( 57 pack year history) with aortic stenosis, Hyperlipidemia and COPD  here for clearance for surgery.   7/12/2017Surgical Clearance Consultation. Jay Walton presents to the office today with his son for surgical clearance for shoulder surgery.. He has a history of COPD, but does not see a pulmonologist.He was diagnosed 5 years ago.Marland KitchenMarland KitchenHe does not take any medications for his COPD.He is a former smoker, and quit in 1998.Smoked 1ppd for 57 years. He has shortness of breath with rest and exertion. He does not have a cough, and does not cough up mucus.He does not feel he is limited by his breathing as much as his age and deconditioning.He states he did have pulmonary function testing in the past, but there is no record in EPIC.He states that the weather changes do not make his breathing worse of better.Eating or drinking cold beverages does not change his breathing. He does not have a trigger for his dyspnea.He denies chest pain, fever, cough, purulent secretions, orthopnea or hemoptysis.  Pre- Op Risk Assessment   [place] Postperative Pulmonary Risk Score Comment Score  Type of surgery - abd ao aneurysm (27), thoracic (21), neurosurgery / upper abdominal / vascular (21), neck (11) Shoulder 0  Emergency Surgery - (11) No 0  ALbumin < 3 or poor nutritional state - (9) 3.7 0  BUN > 30 -  (8) 24 0  Partial or completely dependent functional status - (7) No 0  COPD -  (6) Yes 6  Age - 49 to 62 (4), > 12  (18) 82 years old 6  TOTAL  12  Risk Stratifcation scores  - < 10, 11-19, 20-27, 28-40, >40 Low Moderate      CANET Postperative Pulmonary Risk Score Comment Score  Age - <50 (0), 50-80 (3), >80 (16)  16  Preoperative pulse ox - >96 (0), 91-95 (8), <90 (24) 91% 8  Respiratory infection in last month - Yes (17) no 0  Preoperative anemia - < 10gm% - Yes (11) no 0  Surgical incision - Upper abdominal (15), Thoracic (24) no  0  Duration of surgery - <2h (0), 2-3h (16), >3h (23) <2 hours 0  Emergency Surgery - Yes (8) no 0  TOTAL  24  Risk Stratification - Low (<26), Intermediate (26-44), High (>45) Low    Risk from age and saturations due to COPD. If surgery is longer than 2 hours the risk increases from  low moderate to higher risk..   Risk ameliorating factors are :  Major Pulmonary risks identified in the multifactorial risk analysis are but not limited to a) pneumonia; b) recurrent intubation risk; c) prolonged or recurrent acute respiratory failure needing mechanical ventilation; d) prolonged hospitalization; e) DVT/Pulmonary embolism; f) Acute Pulmonary edema  Recommend 1. Short duration of surgery as much as possible and avoid paralytic if possible 2. Recovery in step down or ICU with Pulmonary consultation 3. DVT prophylaxis 4. Aggressive pulmonary toilet with o2, bronchodilatation, and incentive spirometry and early ambulation     Tests Echo 08/13/2014 ------------------------------------------------------------------- Study Conclusions  - Left ventricle: There was moderate concentric hypertrophy. Systolic function was normal. The estimated ejection fraction was in the range of 55% to 60%. There was an increased relative contribution of atrial contraction to ventricular filling. Doppler parameters are consistent with abnormal left ventricular relaxation (grade 1 diastolic dysfunction). - Aortic valve: Moderately calcified annulus. Trileaflet. Severe thickening and calcification, consistent with sclerosis.  There was mild stenosis. Valve area (VTI): 1.59 cm^2. Valve area (Vmax): 1.57 cm^2. - Atrial septum: There was increased thickness of the septum, consistent with lipomatous hypertrophy.  Spirometry Testing: 01/14/2016  FVC: 1.86 ( 59%) FEV1 1.59 ( 76%) F/F Ratio: 86%  Moderately Severe Restriction  Past medical hx Past Medical History  Diagnosis Date  .  Hyperlipidemia   . Hypertension   . COPD (chronic obstructive pulmonary disease) (HCC)   . Vitamin D deficiency   . Aortic stenosis   . GERD (gastroesophageal reflux disease)   . History of TIAs   . Chronic cough   . DJD (degenerative joint disease)   . Edema   . Bronchitis   . Peripheral neuropathy (HCC)   . Diabetes [facility](HCC)      Past surgical hx, Family hx, Social hx all reviewed.  Current Outpatient Prescriptions on File Prior to Visit  Medication Sig  . Cholecalciferol (VITAMIN D PO) Take 2,000 Units by mouth daily.   . furosemide (LASIX) 20 MG tablet Take 20 mg by mouth daily. Reported on 01/14/2016  . gabapentin (NEURONTIN) 300 MG capsule One po twice during the day, 1-2 tabs at night  . losartan (COZAAR) 100 MG tablet Take 100 mg by mouth daily.   No current facility-administered medications on file prior to visit.     Allergies  Allergen Reactions  . Ace Inhibitors     COUGH  . Esomeprazole Magnesium Swelling    Review Of Systems:  Constitutional:   No  weight loss, night sweats,  Fevers, chills, fatigue, or  lassitude.  HEENT:   No headaches,  Difficulty swallowing,  Tooth/dental problems, or  Sore throat,                No sneezing, itching, ear ache, nasal congestion, post nasal drip,   CV:  No chest pain,  Orthopnea, PND, swelling in lower extremities, anasarca, dizziness, palpitations, syncope.   GI  No heartburn, indigestion, abdominal pain, nausea, vomiting, diarrhea, change in bowel habits, loss of appetite, bloody stools.   Resp: No shortness of breath with exertion or at rest.  No excess mucus, no productive cough,  No non-productive cough,  No coughing up of blood.  No change in color of mucus.  No wheezing.  No chest wall deformity  Skin: no rash or lesions.  GU: no dysuria, change in color of urine, no urgency or frequency.  No flank pain, no hematuria   MS:  No joint pain or swelling.  No decreased range of motion.  No back pain.  Psych:  No  change in mood or affect. No depression or anxiety.  No memory loss.   Vital Signs BP 160/88 mmHg  Pulse 86  Ht 5' 7.25" (1.708 m)  Wt 243 lb (110.224 kg)  BMI 37.78 kg/m2  SpO2 91%   Physical Exam:  General- No distress,  A&Ox3, elderly male using a walker. ENT: No sinus tenderness, TM clear, pale nasal mucosa, no oral exudate,no post nasal drip, no LAN Cardiac: S1, S2, regular rate and rhythm, no murmur Chest: No wheeze/ rales/ dullness; no accessory muscle use, no nasal flaring, no sternal retractions Abd.: Soft Non-tender Ext: No clubbing cyanosis, edema Neuro:  normal strength Skin: No rashes, warm and dry Psych: normal mood and behavior   Assessment/Plan  COPD without exacerbation (HCC) COPD for surgical clearance for shoulder surgery Currently not followed by pulmonology Currently not on any pulmonary medications Risk for pulmonary complications is low to moderate. Plan:  You would most likely benefit from some treatment for your COPD. We can add Breo 1 puff once daily We will instruct you on use today. Rinse mouth after use We will prescribe you with a rescue inhaler for use with wheezing or shortness of breath up to 4 times daily as needed.( Ventolin) Spirometry in the office today. We will clear your for surgery . You are at low to moderate risk for pulmonary complications. Follow up with Dr. Isaiah Serge in 3 months with full PFT's prior. Please contact office for sooner follow up if symptoms do not improve or worsen or seek emergency care   Post Op Treatment should include special attention to pulmonary toilet:  Major Pulmonary risks identified in the multifactorial risk analysis are but not limited to a) pneumonia; b) recurrent intubation risk; c) prolonged or recurrent acute respiratory failure needing mechanical ventilation; d) prolonged hospitalization; e) DVT/Pulmonary embolism; f) Acute Pulmonary edema  Recommend 1. Short duration of surgery as much as  possible and avoid paralytic if possible 2. Recovery in step down or ICU with Pulmonary consultation 3. DVT prophylaxis 4. Aggressive pulmonary toilet with o2, bronchodilatation, and incentive spirometry and early ambulation        Bevelyn Ngo, NP 01/14/2016  5:09 PM   Attending note: I have seen and examined the patient with nurse practitioner/resident and agree with the note. History, labs and imaging reviewed.  82 Y/O with COPD, not on inhalers,  Spirometry reviewed. This shows restriction which I suspect is due to body habitus He is at low mod risk from post op complications for surgery OK to go ahead. Post op management as above Recommend preop CXR. We will start breo inhaler.  Chilton Greathouse MD [facility] Pager [phone] If no answer or after 3pm call: [phone] 01/15/2016, 12:25 PM

## 2016-04-16 ENCOUNTER — Other Ambulatory Visit: Payer: Self-pay | Admitting: Pulmonary Disease

## 2016-04-16 DIAGNOSIS — R0600 Dyspnea, unspecified: Secondary | ICD-10-CM

## 2016-04-19 ENCOUNTER — Ambulatory Visit: Payer: Medicare Other | Admitting: Pulmonary Disease

## 2016-06-08 ENCOUNTER — Encounter (HOSPITAL_BASED_OUTPATIENT_CLINIC_OR_DEPARTMENT_OTHER): Payer: Medicare Other | Attending: Surgery

## 2017-04-02 ENCOUNTER — Inpatient Hospital Stay
Admission: EM | Admit: 2017-04-02 | Discharge: 2017-04-12 | Disposition: A | Discharge status: Other Acute Inpatient Hospital | Payer: Medicare Other | Source: Ambulatory Visit | Attending: Vascular Surgery | Admitting: Vascular Surgery

## 2017-04-02 ENCOUNTER — Other Ambulatory Visit (HOSPITAL_COMMUNITY): Payer: Medicare Other

## 2017-04-02 DIAGNOSIS — J9690 Respiratory failure, unspecified, unspecified whether with hypoxia or hypercapnia: Secondary | ICD-10-CM

## 2017-04-02 DIAGNOSIS — Z95828 Presence of other vascular implants and grafts: Secondary | ICD-10-CM

## 2017-04-02 DIAGNOSIS — Z419 Encounter for procedure for purposes other than remedying health state, unspecified: Secondary | ICD-10-CM

## 2017-04-02 DIAGNOSIS — Z992 Dependence on renal dialysis: Secondary | ICD-10-CM

## 2017-04-02 DIAGNOSIS — N186 End stage renal disease: Secondary | ICD-10-CM

## 2017-04-02 HISTORY — DX: Chronic kidney disease, unspecified: N18.9

## 2017-04-03 LAB — CBC WITH DIFFERENTIAL/PLATELET
Basophils Absolute: 0 10*3/uL (ref 0.0–0.1)
Basophils Relative: 0 %
EOS ABS: 0 10*3/uL (ref 0.0–0.7)
EOS PCT: 0 %
HCT: 46.7 % (ref 39.0–52.0)
Hemoglobin: 14.4 g/dL (ref 13.0–17.0)
LYMPHS ABS: 2.3 10*3/uL (ref 0.7–4.0)
LYMPHS PCT: 14 %
MCH: 28.6 pg (ref 26.0–34.0)
MCHC: 30.8 g/dL (ref 30.0–36.0)
MCV: 92.7 fL (ref 78.0–100.0)
MONO ABS: 0.8 10*3/uL (ref 0.1–1.0)
MONOS PCT: 5 %
Neutro Abs: 13.2 10*3/uL — ABNORMAL HIGH (ref 1.7–7.7)
Neutrophils Relative %: 81 %
PLATELETS: 170 10*3/uL (ref 150–400)
RBC: 5.04 MIL/uL (ref 4.22–5.81)
RDW: 15.7 % — ABNORMAL HIGH (ref 11.5–15.5)
WBC: 16.3 10*3/uL — ABNORMAL HIGH (ref 4.0–10.5)

## 2017-04-03 LAB — COMPREHENSIVE METABOLIC PANEL
ALBUMIN: 2.9 g/dL — AB (ref 3.5–5.0)
ALT: 22 U/L (ref 17–63)
AST: 29 U/L (ref 15–41)
Alkaline Phosphatase: 48 U/L (ref 38–126)
Anion gap: 14 (ref 5–15)
BUN: 99 mg/dL — AB (ref 6–20)
CHLORIDE: 101 mmol/L (ref 101–111)
CO2: 26 mmol/L (ref 22–32)
CREATININE: 3.4 mg/dL — AB (ref 0.61–1.24)
Calcium: 8.3 mg/dL — ABNORMAL LOW (ref 8.9–10.3)
GFR calc Af Amer: 17 mL/min — ABNORMAL LOW (ref >60)
GFR, EST NON AFRICAN AMERICAN: 14 mL/min — AB (ref >60)
GLUCOSE: 153 mg/dL — AB (ref 65–99)
POTASSIUM: 4.6 mmol/L (ref 3.5–5.1)
Sodium: 141 mmol/L (ref 135–145)
Total Bilirubin: 0.9 mg/dL (ref 0.3–1.2)
Total Protein: 5.6 g/dL — ABNORMAL LOW (ref 6.5–8.1)

## 2017-04-04 LAB — BASIC METABOLIC PANEL
ANION GAP: 10 (ref 5–15)
BUN: 101 mg/dL — AB (ref 6–20)
CALCIUM: 8.3 mg/dL — AB (ref 8.9–10.3)
CO2: 31 mmol/L (ref 22–32)
Chloride: 104 mmol/L (ref 101–111)
Creatinine, Ser: 3.24 mg/dL — ABNORMAL HIGH (ref 0.61–1.24)
GFR calc Af Amer: 18 mL/min — ABNORMAL LOW (ref >60)
GFR, EST NON AFRICAN AMERICAN: 15 mL/min — AB (ref >60)
GLUCOSE: 97 mg/dL (ref 65–99)
Potassium: 4.4 mmol/L (ref 3.5–5.1)
SODIUM: 145 mmol/L (ref 135–145)

## 2017-04-04 LAB — CBC
HCT: 45.3 % (ref 39.0–52.0)
Hemoglobin: 14.5 g/dL (ref 13.0–17.0)
MCH: 30.1 pg (ref 26.0–34.0)
MCHC: 32 g/dL (ref 30.0–36.0)
MCV: 94.2 fL (ref 78.0–100.0)
PLATELETS: 113 10*3/uL — AB (ref 150–400)
RBC: 4.81 MIL/uL (ref 4.22–5.81)
RDW: 15.8 % — AB (ref 11.5–15.5)
WBC: 16.2 10*3/uL — AB (ref 4.0–10.5)

## 2017-04-04 LAB — MAGNESIUM: Magnesium: 2.4 mg/dL (ref 1.7–2.4)

## 2017-04-04 NOTE — Consult Note (Signed)
[facility] CONSULT NOTE    Date: 04/04/2017                  Patient Name:  Jay Walton  MRN: [id number]  DOB: 1924-06-06  Age / Sex: 83 y.o., male         PCP: Willow Ora, MD                 Service Requesting Consult: Hospitalist                 Reason for Consult: Acute renal failure            History of Present Illness: Patient is a 83 y.o. male with a PMHx of COPD, diabetes mellitus type 2, GERD, aortic stenosis, hypertension, osteoporosis, history of TIA, who was admitted to [facility] on 04/02/2017 for ongoing treatment of recent acute respiratory failure, COPD exacerbation, pneumonia, and acute kidney injury.  At the outside hospital patient initially presented with cough as well as shortness of breath. He was found to have COPD exacerbation as well as acute respiratory failure. He also developed acute tubular necrosis which required dialysis. He had a temporary femoral dialysis catheter placed on 03/31/2017.  His BUN is currently 101 with a creatinine of 3.24. He is making some urine at this point time but it does appear quite concentrated.   Medications: Current medications: Metronidazole 500 mg IV twice a day, Lipitor 20 mg daily at bedtime, ceftriaxone 2 g IV daily, Pepcid 20 mg daily, fluticasone/salmeterol 1 inhalation twice a day, Neurontin 100 mg 3 times a day, heparin 5000 units subcutaneous every 8 hours, prednisone 20 g daily, Renvela 24 mg by mouth 3 times a day, vitamin D 1000 units daily, Zithromax 500 mg daily    Allergies: Allergies  Allergen Reactions  . Ace Inhibitors     COUGH  . Esomeprazole Magnesium Swelling      Past Medical History: Past Medical History:  Diagnosis Date  . Aortic stenosis   . Bronchitis   . Chronic cough   . COPD (chronic obstructive pulmonary disease) (HCC)   . Diabetes (HCC)   . DJD (degenerative joint disease)   . Edema   . GERD (gastroesophageal reflux disease)   . History of TIAs    . Hyperlipidemia   . Hypertension   . Peripheral neuropathy (HCC)   . Vitamin D deficiency      Past Surgical History: Past Surgical History:  Procedure Laterality Date  . ANKLE SURGERY    . HERNIA REPAIR    . REPLACEMENT TOTAL KNEE    . US ECHOCARDIOGRAPHY  06/14/2007   EF 55-60%     Family History: Family History  Problem Relation Age of Onset  . Pneumonia Mother   . Pneumonia Father      Social History: Social History   Social History  . Marital status: Married    Spouse name: N/A  . Number of children: 2  . Years of education: 13   Occupational History  . Retired    Social History Main Topics  . Smoking status: Former Smoker    Packs/day: 1.00    Years: 57.00    Types: Cigarettes    Quit date: 05/12/1996  . Smokeless tobacco: Not on file  . Alcohol use 0.0 oz/week     Comment: beer or wine once daily  . Drug use: No  . Sexual activity: Not on file   Other Topics Concern  .  Not on file   Social History Narrative   Lives at home alone (son stays with him often).   Right-handed.   3-4 cups caffeine per day.     Review of Systems: Review of Systems  Constitutional: Positive for malaise/fatigue. Negative for chills and fever.  HENT: Positive for hearing loss. Negative for congestion and nosebleeds.   Eyes: Negative for blurred vision and double vision.  Respiratory: Positive for cough and sputum production.   Cardiovascular: Negative for chest pain, palpitations and orthopnea.  Gastrointestinal: Negative for heartburn, nausea and vomiting.  Genitourinary: Negative for dysuria, frequency and urgency.  Musculoskeletal: Negative for back pain and myalgias.  Skin: Negative for itching and rash.  Neurological: Negative for dizziness and seizures.  Endo/Heme/Allergies: Does not bruise/bleed easily.  Psychiatric/Behavioral: Negative for depression. The patient is not nervous/anxious.      Vital Signs: There were no vitals taken for this  visit.  Weight trends: There were no vitals filed for this visit.  Physical Exam: General: NAD, resting comfortably  Head: Normocephalic, atraumatic.  Eyes: Anicteric, EOMI  Nose: Mucous membranes moist, not inflammed, nonerythematous.  Throat: Oropharynx nonerythematous, no exudate appreciated.   Neck: Supple, trachea midline.  Lungs:  Scattered rhonchi, normal effort  Heart: S1S2 2/6 SEM  Abdomen:  BS normoactive. Soft, Nondistended, non-tender.  No masses or organomegaly.  Extremities: 1+ LE edema  Neurologic: A&O X3, Motor strength is 5/5 in the all 4 extremities  Skin: kertaosis of b/l LE's noted    Lab results: Basic Metabolic Panel:  Recent Labs Lab 04/03/17 1036 04/04/17 0621  NA 141 145  K 4.6 4.4  CL 101 104  CO2 26 31  GLUCOSE 153* 97  BUN 99* 101*  CREATININE 3.40* 3.24*  CALCIUM 8.3* 8.3*  MG  --  2.4    Liver Function Tests:  Recent Labs Lab 04/03/17 1036  AST 29  ALT 22  ALKPHOS 48  BILITOT 0.9  PROT 5.6*  ALBUMIN 2.9*   No results for input(s): LIPASE, AMYLASE in the last 168 hours. No results for input(s): AMMONIA in the last 168 hours.  CBC:  Recent Labs Lab 04/03/17 1036 04/04/17 0621  WBC 16.3* 16.2*  NEUTROABS 13.2*  --   HGB 14.4 14.5  HCT 46.7 45.3  MCV 92.7 94.2  PLT 170 113*    Cardiac Enzymes: No results for input(s): CKTOTAL, CKMB, CKMBINDEX, TROPONINI in the last 168 hours.  BNP: Invalid input(s): POCBNP  CBG: No results for input(s): GLUCAP in the last 168 hours.  Microbiology: No results found for this or any previous visit.  Coagulation Studies: No results for input(s): LABPROT, INR in the last 72 hours.  Urinalysis: No results for input(s): COLORURINE, LABSPEC, PHURINE, GLUCOSEU, HGBUR, BILIRUBINUR, KETONESUR, PROTEINUR, UROBILINOGEN, NITRITE, LEUKOCYTESUR in the last 72 hours.  Invalid input(s): APPERANCEUR    Imaging: Dg Chest Port 1 View  Result Date: 04/02/2017 CLINICAL DATA:  Respiratory  failure EXAM: PORTABLE CHEST 1 VIEW COMPARISON:  01/10/2007 FINDINGS: Cardiomegaly with aortic atherosclerosis. Central vascular and interstitial edema with small bilateral pleural effusions. Retrocardiac opacity cannot exclude a retrocardiac infiltrate, atelectasis or effusion. No acute nor suspicious osseous appearing abnormalities. IMPRESSION: Cardiomegaly with mild CHF and bilateral pleural effusions. Increased retrocardiac density is noted that cannot entirely exclude a pneumonic consolidation, atelectasis or effusion at the left lung base. Electronically Signed   By: Tollie Eth M.D.   On: 04/02/2017 20:35      Assessment & Plan: Pt is a 83 y.o. male  with a PMHx of COPD, diabetes mellitus type 2, GERD, aortic stenosis, hypertension, osteoporosis, history of TIA, who was admitted to [facility] on 04/02/2017 for ongoing treatment of recent acute respiratory failure, COPD exacerbation, pneumonia, and acute kidney injury.   1. Acute renal failure secondary to acute tubular necrosis requiring dialysis. The patient has a temporary right femoral dialysis catheter in place. We will consult with vascular surgery to place a PermCath. We will continue patient on a Monday, Wednesday, Friday dialysis schedule for now.  2. Acute respiratory failure/bacterial pneumonia. Patient is on treatment with ceftriaxone and azithromycin. Appears to be breathing comfortably at the moment.  3. Secondary hyperparathyroidism. He is maintained on Renvela 3 tablets by mouth 3 times a day with meals. We will check intact PTH as well as phosphorus.  4. Thanks for consultation.

## 2017-04-05 LAB — CBC
HCT: 45.6 % (ref 39.0–52.0)
Hemoglobin: 13.9 g/dL (ref 13.0–17.0)
MCH: 29.1 pg (ref 26.0–34.0)
MCHC: 30.5 g/dL (ref 30.0–36.0)
MCV: 95.6 fL (ref 78.0–100.0)
Platelets: 100 10*3/uL — ABNORMAL LOW (ref 150–400)
RBC: 4.77 MIL/uL (ref 4.22–5.81)
RDW: 15.3 % (ref 11.5–15.5)
WBC: 12.8 10*3/uL — AB (ref 4.0–10.5)

## 2017-04-05 LAB — RENAL FUNCTION PANEL
ALBUMIN: 2.4 g/dL — AB (ref 3.5–5.0)
ANION GAP: 7 (ref 5–15)
BUN: 66 mg/dL — ABNORMAL HIGH (ref 6–20)
CALCIUM: 8.1 mg/dL — AB (ref 8.9–10.3)
CO2: 29 mmol/L (ref 22–32)
Chloride: 105 mmol/L (ref 101–111)
Creatinine, Ser: 2.4 mg/dL — ABNORMAL HIGH (ref 0.61–1.24)
GFR, EST AFRICAN AMERICAN: 26 mL/min — AB (ref >60)
GFR, EST NON AFRICAN AMERICAN: 22 mL/min — AB (ref >60)
GLUCOSE: 115 mg/dL — AB (ref 65–99)
PHOSPHORUS: 4.2 mg/dL (ref 2.5–4.6)
Potassium: 4.1 mmol/L (ref 3.5–5.1)
SODIUM: 141 mmol/L (ref 135–145)

## 2017-04-06 ENCOUNTER — Encounter (HOSPITAL_COMMUNITY): Payer: Medicare Other

## 2017-04-06 DIAGNOSIS — I10 Essential (primary) hypertension: Secondary | ICD-10-CM

## 2017-04-06 DIAGNOSIS — E119 Type 2 diabetes mellitus without complications: Secondary | ICD-10-CM

## 2017-04-06 DIAGNOSIS — Z992 Dependence on renal dialysis: Secondary | ICD-10-CM

## 2017-04-06 DIAGNOSIS — N186 End stage renal disease: Secondary | ICD-10-CM

## 2017-04-06 LAB — RENAL FUNCTION PANEL
ANION GAP: 9 (ref 5–15)
ANION GAP: 8 (ref 5–15)
Albumin: 2.4 g/dL — ABNORMAL LOW (ref 3.5–5.0)
Albumin: 2.4 g/dL — ABNORMAL LOW (ref 3.5–5.0)
BUN: 80 mg/dL — AB (ref 6–20)
BUN: 67 mg/dL — ABNORMAL HIGH (ref 6–20)
CALCIUM: 8.1 mg/dL — AB (ref 8.9–10.3)
CALCIUM: 8.1 mg/dL — AB (ref 8.9–10.3)
CO2: 29 mmol/L (ref 22–32)
CO2: 29 mmol/L (ref 22–32)
Chloride: 105 mmol/L (ref 101–111)
Chloride: 106 mmol/L (ref 101–111)
Creatinine, Ser: 3.05 mg/dL — ABNORMAL HIGH (ref 0.61–1.24)
Creatinine, Ser: 2.58 mg/dL — ABNORMAL HIGH (ref 0.61–1.24)
GFR calc Af Amer: 19 mL/min — ABNORMAL LOW (ref >60)
GFR calc Af Amer: 23 mL/min — ABNORMAL LOW (ref >60)
GFR calc non Af Amer: 17 mL/min — ABNORMAL LOW (ref >60)
GFR calc non Af Amer: 20 mL/min — ABNORMAL LOW (ref >60)
GLUCOSE: 107 mg/dL — AB (ref 65–99)
GLUCOSE: 238 mg/dL — AB (ref 65–99)
POTASSIUM: 4.9 mmol/L (ref 3.5–5.1)
Phosphorus: 3.1 mg/dL (ref 2.5–4.6)
Phosphorus: 3.4 mg/dL (ref 2.5–4.6)
Potassium: 4.3 mmol/L (ref 3.5–5.1)
SODIUM: 143 mmol/L (ref 135–145)
SODIUM: 143 mmol/L (ref 135–145)

## 2017-04-06 LAB — CBC
HCT: 39 % (ref 39.0–52.0)
HEMATOCRIT: 40.4 % (ref 39.0–52.0)
HEMOGLOBIN: 12.4 g/dL — AB (ref 13.0–17.0)
HEMOGLOBIN: 11.7 g/dL — AB (ref 13.0–17.0)
MCH: 29.2 pg (ref 26.0–34.0)
MCH: 28.8 pg (ref 26.0–34.0)
MCHC: 30.7 g/dL (ref 30.0–36.0)
MCHC: 30 g/dL (ref 30.0–36.0)
MCV: 95.3 fL (ref 78.0–100.0)
MCV: 96.1 fL (ref 78.0–100.0)
Platelets: 118 10*3/uL — ABNORMAL LOW (ref 150–400)
Platelets: 104 10*3/uL — ABNORMAL LOW (ref 150–400)
RBC: 4.24 MIL/uL (ref 4.22–5.81)
RBC: 4.06 MIL/uL — ABNORMAL LOW (ref 4.22–5.81)
RDW: 15.2 % (ref 11.5–15.5)
RDW: 15.6 % — AB (ref 11.5–15.5)
WBC: 15 10*3/uL — AB (ref 4.0–10.5)
WBC: 12.7 10*3/uL — ABNORMAL HIGH (ref 4.0–10.5)

## 2017-04-06 LAB — URINALYSIS, ROUTINE W REFLEX MICROSCOPIC
BILIRUBIN URINE: NEGATIVE
Glucose, UA: NEGATIVE mg/dL
Ketones, ur: NEGATIVE mg/dL
Nitrite: NEGATIVE
PROTEIN: NEGATIVE mg/dL
Specific Gravity, Urine: 1.016 (ref 1.005–1.030)
WBC, UA: NONE SEEN WBC/hpf (ref 0–5)
pH: 5 (ref 5.0–8.0)

## 2017-04-06 LAB — MAGNESIUM: MAGNESIUM: 2.2 mg/dL (ref 1.7–2.4)

## 2017-04-06 LAB — HEPATITIS B SURFACE ANTIGEN: HEP B S AG: NEGATIVE

## 2017-04-06 LAB — HEPATITIS B SURFACE ANTIBODY, QUANTITATIVE: Hepatitis B-Post: <3.1 m[IU]/mL — ABNORMAL LOW (ref >9.9)

## 2017-04-06 LAB — HEPATITIS B CORE ANTIBODY, TOTAL: HEP B C TOTAL AB: NEGATIVE

## 2017-04-06 NOTE — Consult Note (Signed)
Referring Physician: Dr Cherylann Ratel  Patient name: Jay Walton MRN: [id number] DOB: [birth year] Sex: male  REASON FOR CONSULT: dialysis catheter placement  HPI: Jay Walton is a 83 y.o. male currently with renal failure thought to possibly have hope for recovery at this point.  He currently has a femoral catheter that is not working well.  Had dialysis today but had to return about 400 cc fluid due to catheter malfunction.  His underlying reason for admission to [facility] was a respiratory issue followed by [facility].  Other medical problems include COPD, diabetes, hyperlipidemia and hypertension all of which are stable.  He has had no prior neck operations or central lines that he can recall.  Past Medical History:  Diagnosis Date  . Aortic stenosis   . Bronchitis   . Chronic cough   . COPD (chronic obstructive pulmonary disease) (HCC)   . Diabetes (HCC)   . DJD (degenerative joint disease)   . Edema   . GERD (gastroesophageal reflux disease)   . History of TIAs   . Hyperlipidemia   . Hypertension   . Peripheral neuropathy (HCC)   . Vitamin D deficiency    Past Surgical History:  Procedure Laterality Date  . ANKLE SURGERY    . HERNIA REPAIR    . REPLACEMENT TOTAL KNEE    . US ECHOCARDIOGRAPHY  06/14/2007   EF 55-60%    Family History  Problem Relation Age of Onset  . Pneumonia Mother   . Pneumonia Father     SOCIAL HISTORY: Social History   Social History  . Marital status: Married    Spouse name: N/A  . Number of children: 2  . Years of education: 13   Occupational History  . Retired    Social History Main Topics  . Smoking status: Former Smoker    Packs/day: 1.00    Years: 57.00    Types: Cigarettes    Quit date: 05/12/1996  . Smokeless tobacco: Not on file  . Alcohol use 0.0 oz/week     Comment: beer or wine once daily  . Drug use: No  . Sexual activity: Not on file   Other Topics Concern  . Not on file   Social History Narrative   Lives at home alone  (son stays with him often).   Right-handed.   3-4 cups caffeine per day.    Allergies  Allergen Reactions  . Ace Inhibitors     COUGH  . Esomeprazole Magnesium Swelling    No current facility-administered medications for this encounter.     ROS:   General:  No weight loss, Fever, chills  Cardiac: No recent episodes of chest pain/pressure, no shortness of breath at rest.  + shortness of breath with exertion.     Urinary:  chronic Kidney disease,  on HD -  MWF or  TTHS,  Burning with urination,  Frequent urination,  Difficulty urinating;   Skin: No rashes  Psychological: No history of anxiety,  No history of depression   Physical Examination  There were no vitals filed for this visit.  There is no height or weight on file to calculate BMI.  General:  Alert and oriented, no acute distress HEENT: Normal Neck: No JVD Pulmonary: Clear to auscultation bilaterally Cardiac: Regular Rate and Rhythm  Abdomen: Soft, non-tender, non-distended, obese Skin: No rash Neurologic: Upper and lower extremity motor 5/5 and symmetric   Data:  CBC    Component Value Date/Time  WBC 12.7 (H) 04/06/2017 1555   RBC 4.06 (L) 04/06/2017 1555   HGB 11.7 (L) 04/06/2017 1555   HGB 13.9 11/24/2015 1015   HCT 39.0 04/06/2017 1555   HCT 43.2 11/24/2015 1015   PLT 104 (L) 04/06/2017 1555   PLT 229 11/24/2015 1015   MCV 96.1 04/06/2017 1555   MCV 93 11/24/2015 1015   MCH 28.8 04/06/2017 1555   MCHC 30.0 04/06/2017 1555   RDW 15.6 (H) 04/06/2017 1555   RDW 14.5 11/24/2015 1015   LYMPHSABS 2.3 04/03/2017 1036   MONOABS 0.8 04/03/2017 1036   EOSABS 0.0 04/03/2017 1036   BASOSABS 0.0 04/03/2017 1036    BMET    Component Value Date/Time   NA 143 04/06/2017 0443   NA 142 11/24/2015 1015   K 4.3 04/06/2017 0443   CL 105 04/06/2017 0443   CO2 29 04/06/2017 0443   GLUCOSE 107 (H) 04/06/2017 0443   BUN 80 (H) 04/06/2017 0443   BUN 24 11/24/2015 1015   CREATININE  3.05 (H) 04/06/2017 0443   CALCIUM 8.1 (L) 04/06/2017 0443   GFRNONAA 17 (L) 04/06/2017 0443   GFRAA 19 (L) 04/06/2017 0443     ASSESSMENT:  Needs HD access   PLAN:  Place Palindrome catheter in OR tomorrow by my partner Dr Randie Heinz  NPO p midnight Consent  Son wishes to be called post procedure [phone]   Fabienne Bruns, MD [facility] Office: [phone] Pager: [phone]

## 2017-04-06 NOTE — Anesthesia Preprocedure Evaluation (Addendum)
Anesthesia Evaluation  Patient identified by MRN, date of birth, ID band Patient awake    Reviewed: Allergy & Precautions, NPO status , Patient's Chart, lab work & pertinent test results  Airway Mallampati: II  TM Distance: >3 FB Neck ROM: Limited    Dental  (+) Edentulous Upper, Edentulous Lower   Pulmonary COPD, former smoker,    Pulmonary exam normal breath sounds clear to auscultation       Cardiovascular hypertension, Normal cardiovascular exam+ Valvular Problems/Murmurs AS  Rhythm:Regular Rate:Normal  TTE 2016 - Moderate concentric LVH. Ejection fraction wasin the range of 55% to 60%. Grade 1 diastolic dysfunction. Aortic sclerosis with mild stenosis.    Neuro/Psych TIAnegative psych ROS   GI/Hepatic Neg liver ROS, GERD  ,  Endo/Other  negative endocrine ROSdiabetes  Renal/GU Renal InsufficiencyRenal disease  negative genitourinary   Musculoskeletal  (+) Arthritis , Osteoarthritis,    Abdominal   Peds  Hematology Thrombocytopenia   Anesthesia Other Findings   Reproductive/Obstetrics                            Anesthesia Physical Anesthesia Plan  ASA: III  Anesthesia Plan: General   Post-op Pain Management:    Induction: Intravenous  PONV Risk Score and Plan: 3 and Ondansetron and Treatment may vary due to age or medical condition  Airway Management Planned: LMA  Additional Equipment: None  Intra-op Plan:   Post-operative Plan: Extubation in OR  Informed Consent: I have reviewed the patients History and Physical, chart, labs and discussed the procedure including the risks, benefits and alternatives for the proposed anesthesia with the patient or authorized representative who has indicated his/her understanding and acceptance.   Dental advisory given  Plan Discussed with: CRNA  Anesthesia Plan Comments: (Discussed DNR at length with patient. Patient is agreeable to  periprocedural reversal of DNR, allowing for medical team to administer IVF, vasopressors, manage the airway with invasive devices, and initiate CPR should the need arise.)       Anesthesia Quick Evaluation

## 2017-04-06 NOTE — Progress Notes (Signed)
[facility]  ROUNDING NOTE   Subjective:  Dialysis catheter did not work very well at all today. He is awaiting vascular surgery consultation for PermCath placement. Majority of today's dialysis treatment was completed.   Objective:  Vital signs in last 24 hours:  Pulse 100 respirations 26 blood pressure 104/78   Physical Exam: General: Chronically ill appearing  Head: Normocephalic, atraumatic. Moist oral mucosal membranes  Eyes: Anicteric  Neck: Supple, trachea midline  Lungs:  Clear to auscultation, normal effort  Heart: S1S2 no rubs  Abdomen:  Soft, nontender, bowel sounds present  Extremities: 1+ peripheral edema.  Neurologic: Awake, alert, following commands  Skin: Hyperkeratosis b/l LE's noted, bilateral LE rubor as well  Access: Temporary right femoral dialysis catheter    Basic Metabolic Panel:  Recent Labs Lab 04/03/17 1036 04/04/17 0621 04/05/17 0638 04/06/17 0443  NA 141 145 141 143  K 4.6 4.4 4.1 4.3  CL 101 104 105 105  CO2 GLUCOSE 153* 97 115* 107*  BUN 99* 101* 66* 80*  CREATININE 3.40* 3.24* 2.40* 3.05*  CALCIUM 8.3* 8.3* 8.1* 8.1*  MG  --  2.4  --  2.2  PHOS  --   --  4.2 3.1    Liver Function Tests:  Recent Labs Lab 04/03/17 1036 04/05/17 0638 04/06/17 0443  AST 29  --   --   ALT 22  --   --   ALKPHOS 48  --   --   BILITOT 0.9  --   --   PROT 5.6*  --   --   ALBUMIN 2.9* 2.4* 2.4*   No results for input(s): LIPASE, AMYLASE in the last 168 hours. No results for input(s): AMMONIA in the last 168 hours.  CBC:  Recent Labs Lab 04/03/17 1036 04/04/17 0621 04/05/17 0638 04/06/17 0443  WBC 16.3* 16.2* 12.8* 15.0*  NEUTROABS 13.2*  --   --   --   HGB 14.4 14.5 13.9 12.4*  HCT 46.7 45.3 45.6 40.4  MCV 92.7 94.2 95.6 95.3  PLT 170 113* 100* 118*    Cardiac Enzymes: No results for input(s): CKTOTAL, CKMB, CKMBINDEX, TROPONINI in the last 168 hours.  BNP: Invalid input(s): POCBNP  CBG: No results  for input(s): GLUCAP in the last 168 hours.  Microbiology: Results for orders placed or performed during the hospital encounter of 04/02/17  Culture, respiratory (NON-Expectorated)     Status: None (Preliminary result)   Collection Time: 04/05/17 11:52 PM  Result Value Ref Range Status   Specimen Description TRACHEAL ASPIRATE  Final   Special Requests NONE  Final   Gram Stain   Final    ABUNDANT WBC PRESENT,BOTH PMN AND MONONUCLEAR RARE SQUAMOUS EPITHELIAL CELLS PRESENT FEW GRAM NEGATIVE RODS FEW GRAM POSITIVE COCCI FEW YEAST RARE GRAM POSITIVE RODS    Culture PENDING  Incomplete   Report Status PENDING  Incomplete    Coagulation Studies: No results for input(s): LABPROT, INR in the last 72 hours.  Urinalysis:  Recent Labs  04/06/17 0401  COLORURINE YELLOW  LABSPEC 1.016  PHURINE 5.0  GLUCOSEU NEGATIVE  HGBUR LARGE*  BILIRUBINUR NEGATIVE  KETONESUR NEGATIVE  PROTEINUR NEGATIVE  NITRITE NEGATIVE  LEUKOCYTESUR SMALL*      Imaging: No results found.   Medications:       Assessment/ Plan:  83 y.o. male with a PMHx of COPD, diabetes mellitus type 2, GERD, aortic stenosis, hypertension, osteoporosis, history of TIA, who was admitted to [facility] on  04/02/2017 for ongoing treatment of recent acute respiratory failure, COPD exacerbation, pneumonia, and acute kidney injury.   1. Acute renal failure secondary to acute tubular necrosis requiring dialysis. The patient is seen and evaluated after dialysis. His temporary femoral dialysis catheter was not working very well at all.We have consulted with vascular surgery for PermCath placement. Hopefully to use a PermCath for Fridays dialysis treatment.  2. Acute respiratory failure/bacterial pneumonia. Per nursing the patient continues to have significant cough especially with drinking fluids. He is likely aspirating. Further management per hospitalist.  3. Secondary hyperparathyroidism.  Phosphorus currently 3.1  and at target. Continue Renvela.  4. Further plan as patient progresses.   LOS: 0 Garen Woolbright 10/3/20183:15 PM

## 2017-04-07 ENCOUNTER — Encounter (HOSPITAL_COMMUNITY)
Admission: EM | Disposition: A | Discharge status: Other Acute Inpatient Hospital | Payer: Self-pay | Source: Ambulatory Visit

## 2017-04-07 ENCOUNTER — Encounter (HOSPITAL_BASED_OUTPATIENT_CLINIC_OR_DEPARTMENT_OTHER): Payer: Medicare Other

## 2017-04-07 ENCOUNTER — Other Ambulatory Visit (HOSPITAL_COMMUNITY): Payer: Medicare Other

## 2017-04-07 ENCOUNTER — Encounter (HOSPITAL_COMMUNITY): Payer: Medicare Other | Admitting: Anesthesiology

## 2017-04-07 DIAGNOSIS — Z992 Dependence on renal dialysis: Secondary | ICD-10-CM | POA: Diagnosis not present

## 2017-04-07 DIAGNOSIS — N186 End stage renal disease: Secondary | ICD-10-CM | POA: Diagnosis not present

## 2017-04-07 DIAGNOSIS — R609 Edema, unspecified: Secondary | ICD-10-CM

## 2017-04-07 DIAGNOSIS — I481 Persistent atrial fibrillation: Secondary | ICD-10-CM

## 2017-04-07 HISTORY — PX: INSERTION OF DIALYSIS CATHETER: SHX1324

## 2017-04-07 LAB — GLUCOSE, CAPILLARY
GLUCOSE-CAPILLARY: 126 mg/dL — AB (ref 65–99)
GLUCOSE-CAPILLARY: 122 mg/dL — AB (ref 65–99)

## 2017-04-07 LAB — HEPATIC FUNCTION PANEL
ALBUMIN: 2.6 g/dL — AB (ref 3.5–5.0)
ALK PHOS: 49 U/L (ref 38–126)
ALT: 32 U/L (ref 17–63)
AST: 32 U/L (ref 15–41)
BILIRUBIN TOTAL: 0.6 mg/dL (ref 0.3–1.2)
Bilirubin, Direct: 0.1 mg/dL (ref 0.1–0.5)
Indirect Bilirubin: 0.5 mg/dL (ref 0.3–0.9)
Total Protein: 5.3 g/dL — ABNORMAL LOW (ref 6.5–8.1)

## 2017-04-07 LAB — PTH, INTACT AND CALCIUM
Calcium, Total (PTH): 8.3 mg/dL — ABNORMAL LOW (ref 8.6–10.2)
PTH: 133 pg/mL — AB (ref 15–65)

## 2017-04-07 LAB — CBC
HEMATOCRIT: 41.1 % (ref 39.0–52.0)
HEMOGLOBIN: 12.2 g/dL — AB (ref 13.0–17.0)
MCH: 28.8 pg (ref 26.0–34.0)
MCHC: 29.7 g/dL — ABNORMAL LOW (ref 30.0–36.0)
MCV: 97.2 fL (ref 78.0–100.0)
Platelets: 110 10*3/uL — ABNORMAL LOW (ref 150–400)
RBC: 4.23 MIL/uL (ref 4.22–5.81)
RDW: 15.8 % — AB (ref 11.5–15.5)
WBC: 12.8 10*3/uL — AB (ref 4.0–10.5)

## 2017-04-07 LAB — URINE CULTURE: Culture: NO GROWTH

## 2017-04-07 LAB — PROTIME-INR
INR: 0.98
Prothrombin Time: 12.9 seconds (ref 11.4–15.2)

## 2017-04-07 SURGERY — INSERTION OF DIALYSIS CATHETER
Anesthesia: General

## 2017-04-07 MED ORDER — PHENYLEPHRINE 40 MCG/ML (10ML) SYRINGE FOR IV PUSH (FOR BLOOD PRESSURE SUPPORT)
PREFILLED_SYRINGE | INTRAVENOUS | Status: DC | PRN
  Administered 2017-04-07: 40 ug via INTRAVENOUS
  Administered 2017-04-07: 120 ug via INTRAVENOUS

## 2017-04-07 MED ORDER — SODIUM CHLORIDE 0.9 % IV SOLN
INTRAVENOUS | Status: DC
  Administered 2017-04-07: 10:00:00 via INTRAVENOUS

## 2017-04-07 MED ORDER — 0.9 % SODIUM CHLORIDE (POUR BTL) OPTIME
TOPICAL | Status: DC | PRN
  Administered 2017-04-07: 1000 mL

## 2017-04-07 MED ORDER — ONDANSETRON HCL 4 MG/2ML IJ SOLN: 4.0000 mg | Freq: Once | INTRAMUSCULAR | Status: DC | PRN

## 2017-04-07 MED ORDER — ESMOLOL HCL 100 MG/10ML IV SOLN
INTRAVENOUS | Status: AC
  Filled 2017-04-07: qty 10

## 2017-04-07 MED ORDER — CEFAZOLIN SODIUM 1 G IJ SOLR
INTRAMUSCULAR | Status: AC
  Filled 2017-04-07: qty 20

## 2017-04-07 MED ORDER — FENTANYL CITRATE (PF) 250 MCG/5ML IJ SOLN
INTRAMUSCULAR | Status: AC
  Filled 2017-04-07: qty 5

## 2017-04-07 MED ORDER — CEFAZOLIN SODIUM-DEXTROSE 2-3 GM-% IV SOLR
INTRAVENOUS | Status: DC | PRN
  Administered 2017-04-07: 2 g via INTRAVENOUS

## 2017-04-07 MED ORDER — NALOXONE HCL 0.4 MG/ML IJ SOLN
INTRAMUSCULAR | Status: AC
  Filled 2017-04-07: qty 1

## 2017-04-07 MED ORDER — HEPARIN SODIUM (PORCINE) 1000 UNIT/ML IJ SOLN
INTRAMUSCULAR | Status: AC
  Filled 2017-04-07: qty 1

## 2017-04-07 MED ORDER — FENTANYL CITRATE (PF) 100 MCG/2ML IJ SOLN: 25.0000 ug | INTRAMUSCULAR | Status: DC | PRN

## 2017-04-07 MED ORDER — LIDOCAINE 2% (20 MG/ML) 5 ML SYRINGE
INTRAMUSCULAR | Status: AC
  Filled 2017-04-07: qty 5

## 2017-04-07 MED ORDER — PHENYLEPHRINE 40 MCG/ML (10ML) SYRINGE FOR IV PUSH (FOR BLOOD PRESSURE SUPPORT)
PREFILLED_SYRINGE | INTRAVENOUS | Status: AC
  Filled 2017-04-07: qty 10

## 2017-04-07 MED ORDER — LIDOCAINE-EPINEPHRINE (PF) 1 %-1:200000 IJ SOLN
INTRAMUSCULAR | Status: AC
  Filled 2017-04-07: qty 30

## 2017-04-07 MED ORDER — LIDOCAINE 2% (20 MG/ML) 5 ML SYRINGE
INTRAMUSCULAR | Status: DC | PRN
  Administered 2017-04-07: 60 mg via INTRAVENOUS

## 2017-04-07 MED ORDER — PROPOFOL 10 MG/ML IV BOLUS
INTRAVENOUS | Status: DC | PRN
  Administered 2017-04-07: 60 mg via INTRAVENOUS

## 2017-04-07 MED ORDER — PHENYLEPHRINE HCL 10 MG/ML IJ SOLN
INTRAVENOUS | Status: DC | PRN
  Administered 2017-04-07: 10 ug/min via INTRAVENOUS

## 2017-04-07 MED ORDER — PROPOFOL 10 MG/ML IV BOLUS
INTRAVENOUS | Status: AC
  Filled 2017-04-07: qty 20

## 2017-04-07 MED ORDER — HEPARIN SODIUM (PORCINE) 1000 UNIT/ML IJ SOLN
INTRAMUSCULAR | Status: DC | PRN
  Administered 2017-04-07: 1000 [IU]

## 2017-04-07 MED ORDER — ONDANSETRON HCL 4 MG/2ML IJ SOLN
INTRAMUSCULAR | Status: AC
  Filled 2017-04-07: qty 2

## 2017-04-07 MED ORDER — SODIUM CHLORIDE 0.9 % IV SOLN
INTRAVENOUS | Status: DC | PRN
  Administered 2017-04-07: 10:00:00

## 2017-04-07 MED ORDER — ONDANSETRON HCL 4 MG/2ML IJ SOLN
INTRAMUSCULAR | Status: DC | PRN
  Administered 2017-04-07: 4 mg via INTRAVENOUS

## 2017-04-07 SURGICAL SUPPLY — 36 items
ADH SKN CLS APL DERMABOND .7 (GAUZE/BANDAGES/DRESSINGS) ×1
BAG DECANTER FOR FLEXI CONT (MISCELLANEOUS) ×3 IMPLANT
BIOPATCH RED 1 DISK 7.0 (GAUZE/BANDAGES/DRESSINGS) ×2 IMPLANT
BIOPATCH RED 1IN DISK 7.0MM (GAUZE/BANDAGES/DRESSINGS) ×1
CATH PALINDROME RT-P 15FX19CM (CATHETERS) ×2 IMPLANT
COVER PROBE W GEL 5X96 (DRAPES) ×3 IMPLANT
COVER SURGICAL LIGHT HANDLE (MISCELLANEOUS) ×3 IMPLANT
DERMABOND ADVANCED (GAUZE/BANDAGES/DRESSINGS) ×2
DERMABOND ADVANCED .7 DNX12 (GAUZE/BANDAGES/DRESSINGS) ×1 IMPLANT
DRAPE C-ARM 42X72 X-RAY (DRAPES) ×3 IMPLANT
DRAPE CHEST BREAST 15X10 FENES (DRAPES) ×3 IMPLANT
GAUZE SPONGE 4X4 16PLY XRAY LF (GAUZE/BANDAGES/DRESSINGS) ×3 IMPLANT
GLOVE BIO SURGEON STRL SZ7.5 (GLOVE) ×3 IMPLANT
GOWN STRL REUS W/ TWL LRG LVL3 (GOWN DISPOSABLE) ×1 IMPLANT
GOWN STRL REUS W/ TWL XL LVL3 (GOWN DISPOSABLE) ×1 IMPLANT
GOWN STRL REUS W/TWL LRG LVL3 (GOWN DISPOSABLE) ×3
GOWN STRL REUS W/TWL XL LVL3 (GOWN DISPOSABLE) ×3
KIT BASIN OR (CUSTOM PROCEDURE TRAY) ×3 IMPLANT
KIT ROOM TURNOVER OR (KITS) ×3 IMPLANT
NDL 18GX1X1/2 (RX/OR ONLY) (NEEDLE) ×1 IMPLANT
NDL HYPO 25GX1X1/2 BEV (NEEDLE) ×1 IMPLANT
NEEDLE 18GX1X1/2 (RX/OR ONLY) (NEEDLE) ×3 IMPLANT
NEEDLE HYPO 25GX1X1/2 BEV (NEEDLE) ×3 IMPLANT
NS IRRIG 1000ML POUR BTL (IV SOLUTION) ×3 IMPLANT
PACK SURGICAL SETUP 50X90 (CUSTOM PROCEDURE TRAY) ×3 IMPLANT
PAD ARMBOARD 7.5X6 YLW CONV (MISCELLANEOUS) ×6 IMPLANT
SOAP 2 % CHG 4 OZ (WOUND CARE) ×3 IMPLANT
SUT ETHILON 3 0 PS 1 (SUTURE) ×3 IMPLANT
SUT MNCRL AB 4-0 PS2 18 (SUTURE) ×3 IMPLANT
SYR 10ML LL (SYRINGE) ×3 IMPLANT
SYR 20CC LL (SYRINGE) ×6 IMPLANT
SYR 5ML LL (SYRINGE) ×3 IMPLANT
SYR CONTROL 10ML LL (SYRINGE) ×3 IMPLANT
TOWEL GREEN STERILE FF (TOWEL DISPOSABLE) ×3 IMPLANT
TOWEL OR 17X26 4PK STRL BLUE (TOWEL DISPOSABLE) ×3 IMPLANT
WATER STERILE IRR 1000ML POUR (IV SOLUTION) ×3 IMPLANT

## 2017-04-07 NOTE — Progress Notes (Signed)
Report received from [facility], RN from the [employer].

## 2017-04-07 NOTE — Anesthesia Postprocedure Evaluation (Signed)
Anesthesia Post Note  Patient: Jay Walton  Procedure(s) Performed: PLACEMENT PALINDROME CATHETER - RIGHT INTERNAL JUGULAR PLACEMENT (N/A )     Patient location during evaluation: PACU Anesthesia Type: General Level of consciousness: awake and alert Pain management: pain level controlled Vital Signs Assessment: post-procedure vital signs reviewed and stable Respiratory status: spontaneous breathing, nonlabored ventilation, respiratory function stable and patient connected to nasal cannula oxygen Cardiovascular status: blood pressure returned to baseline and stable (Patient in new AFib in PACU, HD stable, HR in 80-90's. Cardiology contacted for consult after EKG done) Postop Assessment: no apparent nausea or vomiting Anesthetic complications: no    Last Vitals:  Vitals:   04/07/17 1115 04/07/17 1120  BP: 122/78   Pulse:    Resp: 15 16  Temp:    SpO2:      Last Pain:  Vitals:   04/07/17 1120  PainSc: 0-No pain                 Beryle Lathe

## 2017-04-07 NOTE — Consult Note (Signed)
Cardiology Consultation:   Patient ID: PERCY WINTERROWD; [id number]; December 19, 1923   Admit date: 04/02/2017 Date of Consult: 04/07/2017  Primary Care Provider: Willow Ora, MD Primary Cardiologist: Dr. Elease Hashimoto   Patient Profile:   Jay Walton is a 83 y.o. male with a hx of HTN, HLD, mild aortic stenosis, COPD, TIA, DM, Chronic venous stasis dermatitis of both lower extremities and CKD  who is being seen today for the evaluation of Afib at the request of Dr. Judeth Cornfield.   Echocardiogram performed February, 2016 reveals heavily calcified aortic valve but with only mild aortic stenosis. He has normal left ventricle systolic function with an EF of 55-60%.  Last seen by Dr. Elease Hashimoto 01/2016 after 5 years for pre-operative evaluation. Cleared without any intervention.   History of Present Illness:   Mr. Schumm was admitted to [facility] 03/24/17-04/02/17 for COPD exacerbation, HCAP, AKI, aacute respiratory failure. He developed relative hypotension the hospital, then he had acute kidney injury likely secondary to ATN. The patient is started on hemodialysis with improved renal function. He was on afib during admission and converted to sinus base on EKG/telemetry (no documentation on note). Echo showed LVEF of >55%. Discharge to [facility] for further management. The patient was seen by nephrology who consulted vascular surgery while here for dialysis catheter placement due to malfunctioning of femoral catheter. S/p Right internal jugular vein tunneled dialysis catheter placement with 19cm diatek 04/07/17.   Patient is in atrial fibrillation at controlled rate since admission to [facility]. No EKG on admission.  Cardiology is asked for further management after procedure today. CXR today showed mild interstitial edema. Not on anticoagulation.   Past Medical History:  Diagnosis Date  . Aortic stenosis   . Bronchitis   . Chronic cough   . Chronic kidney disease   . COPD (chronic obstructive  pulmonary disease) (HCC)   . Diabetes (HCC)   . DJD (degenerative joint disease)   . Edema   . GERD (gastroesophageal reflux disease)   . History of TIAs   . Hyperlipidemia   . Hypertension   . Peripheral neuropathy   . Vitamin D deficiency     Past Surgical History:  Procedure Laterality Date  . ANKLE SURGERY    . HERNIA REPAIR    . REPLACEMENT TOTAL KNEE    . US ECHOCARDIOGRAPHY  06/14/2007   EF 55-60%     Inpatient Medications: Scheduled Meds: Per paper chart  Continuous Infusions: . sodium chloride     PRN Meds:  Allergies:    Allergies  Allergen Reactions  . Ace Inhibitors     COUGH  . Esomeprazole Magnesium Swelling    Social History:   Social History   Social History  . Marital status: Married    Spouse name: N/A  . Number of children: 2  . Years of education: 13   Occupational History  . Retired    Social History Main Topics  . Smoking status: Former Smoker    Packs/day: 1.00    Years: 57.00    Types: Cigarettes    Quit date: 05/12/1996  . Smokeless tobacco: Former Neurosurgeon  . Alcohol use 0.0 oz/week     Comment: beer or wine once daily  . Drug use: No  . Sexual activity: Not on file   Other Topics Concern  . Not on file   Social History Narrative   Lives at home alone (son stays with him often).   Right-handed.   3-4 cups  caffeine per day.    Family History:    Family History  Problem Relation Age of Onset  . Pneumonia Mother   . Pneumonia Father      ROS:  Please see the history of present illness.  ROS All other ROS reviewed and negative.     Physical Exam/Data:   Vitals:   04/07/17 1130 04/07/17 1137 04/07/17 1138 04/07/17 1145  BP:      Pulse:      Resp:  19 18   Temp:    (!) 97 F (36.1 C)  SpO2: 95%   94%    Intake/Output Summary (Last 24 hours) at 04/07/17 1333 Last data filed at 04/07/17 1100  Gross per 24 hour  Intake              330 ml  Output              310 ml  Net               20 ml    General:   Well nourished, well developed, in no acute distress HEENT: normal Lymph: no adenopathy Neck: no JVD Endocrine:  No thryomegaly Vascular: No carotid bruits; FA pulses 2+ bilaterally without bruits  Cardiac:  normal S1, S2; Irregularly irregular ; no murmur  Lungs:  clear to auscultation bilaterally, no wheezing, rhonchi or rales  Abd: soft, nontender, no hepatomegaly  Ext: no edema Musculoskeletal:  No deformities, BUE and BLE strength normal and equal Skin: warm and dry  Neuro:  CNs 2-12 intact, no focal abnormalities noted Psych:  Normal affect   EKG:  The EKG was personally reviewed and demonstrates:  Afib at rate of 91 bpm Telemetry:  Telemetry was personally reviewed and demonstrates:  afib at rate of 70s  Relevant CV Studies: Echo 08/13/14 Study Conclusions  - Left ventricle: There was moderate concentric hypertrophy. Systolic function was normal. The estimated ejection fraction was in the range of 55% to 60%. There was an increased relative contribution of atrial contraction to ventricular filling. Doppler parameters are consistent with abnormal left ventricular relaxation (grade 1 diastolic dysfunction). - Aortic valve: Moderately calcified annulus. Trileaflet. Severe thickening and calcification, consistent with sclerosis. There was mild stenosis. Valve area (VTI): 1.59 cm^2. Valve area (Vmax): 1.57 cm^2. - Atrial septum: There was increased thickness of the septum, consistent with lipomatous hypertrophy.  Echo 03/26/17  [facility] @  [place], [place] [address]  Interpretation Summary  Left Ventricle The left ventricle is normal in size. There is mild asymmetric left ventricular hypertrophy. Ejection Fraction = >55%.   Right Ventricle The right ventricle is normal size. The right ventricular ejection fraction is normal.   Atria LA Volume is c/w normal (<35 ml/m2). Right atrial size is normal.  The interatrial septum is intact with no evidence for an atrial septal defect.  Mitral Valve The mitral valve leaflets appear normal. There is no evidence of stenosis, fluttering, or prolapse. There is trace mitral regurgitation.  Tricuspid Valve The tricuspid valve leaflets are thin and pliable and the valve motion is normal. There is trace tricuspid regurgitation. Right ventricular systolic pressure is normal.  Aortic Valve The aortic valve is trileaflet and heavily calcified. Opening of the valve is reduced. There is mild aortic stenosis. The max is 26 mmHg across the aortic valve. The mean is 13 mmHg across the aortic valve. No aortic regurgitation is present.  Pulmonic Valve The pulmonic valve leaflets are thin and pliable; valve motion is normal.  There is trace pulmonic valvular regurgitation.   Great Vessels The aortic root is normal size.  Pericardium/Pleural There is no pericardial effusion. There is no pleural effusion.  Electronically signed EA:[id number] Leonette Most, M.D.on 03/26/2017 01:02 PM  Laboratory Data:  Chemistry Recent Labs Lab 04/05/17 9147 04/06/17 0443 04/06/17 1943  NA 141 143 143  K 4.1 4.3 4.9  CL 105 105 106  CO2 GLUCOSE 115* 107* 238*  BUN 66* 80* 67*  CREATININE 2.40* 3.05* 2.58*  CALCIUM 8.1* 8.1* 8.1*  GFRNONAA 22* 17* 20*  GFRAA 26* 19* 23*  ANIONGAP Recent Labs Lab 04/03/17 1036 04/05/17 0638 04/06/17 0443 04/06/17 1943  PROT 5.6*  --   --   --   ALBUMIN 2.9* 2.4* 2.4* 2.4*  AST 29  --   --   --   ALT 22  --   --   --   ALKPHOS 48  --   --   --   BILITOT 0.9  --   --   --    Hematology Recent Labs Lab 04/05/17 0638 04/06/17 0443 04/06/17 1555  WBC 12.8* 15.0* 12.7*  RBC 4.77 4.24 4.06*  HGB 13.9 12.4* 11.7*  HCT 45.6 40.4 39.0  MCV 95.6 95.3 96.1  MCH 29.1 29.2 28.8  MCHC 30.5 30.7 30.0  RDW 15.3 15.2 15.6*  PLT 100* 118* 104*    Radiology/Studies:  Dg Chest Port 1 View  Result  Date: 04/07/2017 CLINICAL DATA:  Status post dialysis catheter placement EXAM: PORTABLE CHEST 1 VIEW COMPARISON:  Portable chest x-ray of April 02, 2017 FINDINGS: The lungs are adequately inflated. The interstitial markings are increased similar to that seen previously. The cardiac silhouette is enlarged. There small bilateral pleural effusions. The dual-lumen dialysis catheter tip projects over the midportion of the SVC. There is no postprocedure pneumothorax. There is calcification in the wall of the aortic arch. The observed bony thorax exhibits no acute abnormalities. IMPRESSION: CHF with mild interstitial edema and small bilateral pleural effusions. No postprocedure complication following dialysis catheter placement. Thoracic aortic atherosclerosis. Electronically Signed   By: David  [place] M.D.   On: 04/07/2017 11:34   Dg Fluoro Guide Cv Line-no Report  Result Date: 04/07/2017 Fluoroscopy was utilized by the requesting physician.  No radiographic interpretation.    Assessment and Plan:   1. Atria fibrillation at a controlled ventricular rate - Paroxysmal versus persistent. No clear documentation. Her EKG report of outside hospital patient --> converted to sinus rhythm at some point however no documentation on progress note or discharge summary. Patient is in A. Fib since admission to [facility]. Rate controlled. He is not anticoagulated. - Patient states that he has irregular heart rate for many years. He was in  sinus rhythm on EKG of 01/16/2016 when seen by Dr. Melburn Popper. He is not able to provide reason for not being on anticoagulation. - CHADSVASC score to at least 6. Will get pharmacy consult for coumadin. Not NOAC candidate to renal failure. May add BB for rate control as needed.  - CXR showed mild edema. Volume managed by dialysis.   2. Mild AS - No change from prior echo. No significant dyspnea. No further work up needed.      For questions or updates, please contact [facility] Please consult [email or web address] for contact info under Cardiology/STEMI.   Lorelei Pont, PA  04/07/2017 1:33 PM   Patient examined chart reviewed Discussed care with patient and  PA Exam with elderly edenulous male New dialysis catheter right subclavian clear lungs anteriorly Old right femoral catheter still in place Mild AS murmur and plus 2 LE edema with feet wrapped In boots. Not clear how long he has been in afib. Rates are ok. CHADSVASC score at least 6 Will ask pharmacy to start coumadin Discussed anticoagulation and stroke prevention with patient And willing to start. Avoid novel agents due to renal failure.   Charlton Haws

## 2017-04-07 NOTE — OR Nursing (Signed)
Denture plates times 2 placed in denture cup. Tagged with patient sticker and transferred to PACU with patient. Given to receiving nurse in PACU spot 8.

## 2017-04-07 NOTE — Anesthesia Procedure Notes (Signed)
Procedure Name: LMA Insertion Date/Time: 04/07/2017 10:10 AM Performed by: Geraldo Docker Pre-anesthesia Checklist: Patient identified, Patient being monitored, Timeout performed, Emergency Drugs available and Suction available Patient Re-evaluated:Patient Re-evaluated prior to induction Oxygen Delivery Method: Circle System Utilized Preoxygenation: Pre-oxygenation with 100% oxygen Induction Type: IV induction Ventilation: Mask ventilation without difficulty LMA: LMA inserted LMA Size: 5.0 Number of attempts: 1 Placement Confirmation: positive ETCO2 and breath sounds checked- equal and bilateral Tube secured with: Tape Dental Injury: Teeth and Oropharynx as per pre-operative assessment

## 2017-04-07 NOTE — Progress Notes (Signed)
VASCULAR LAB PRELIMINARY  PRELIMINARY  PRELIMINARY  PRELIMINARY  Bilateral lower extremity venous duplex completed.    Preliminary report:  There is no obvious evidence of DVT or SVT noted in the visualized veins of the bilateral lower extremities.   Kymber Kosar, RVT 04/07/2017, 3:17 PM

## 2017-04-07 NOTE — Op Note (Signed)
    OPERATIVE NOTE  PROCEDURE: Right internal jugular vein tunneled dialysis catheter placement with 19cm diatek   PRE-OPERATIVE DIAGNOSIS: end-stage renal failure  POST-OPERATIVE DIAGNOSIS: same as above  SURGEON: Atreyu Mak C. Randie Heinz, MD  ANESTHESIA: general  ESTIMATED BLOOD LOSS: 30 cc  FINDING(S): 1.  Tips of the catheter in the SVC on fluoroscopy 2.  No obvious pneumothorax on fluoroscopy  SPECIMEN(S):  none  INDICATIONS:   Jay Walton is a 83 y.o. male who presents with end stage renal disease.  The patient presents for tunneled dialysis catheter placement.  The patient is aware the risks of tunneled dialysis catheter placement include but are not limited to: bleeding, infection, central venous injury, pneumothorax, possible venous stenosis, possible malpositioning in the venous system, and possible infections related to long-term catheter presence.  The patient was aware of these risks and agreed to proceed.  DESCRIPTION: After written full informed consent was obtained from the patient, the patient was taken back to the operating room.  Prior to induction, the patient was given IV antibiotics.  After obtaining general anesthesia, the patient was prepped and draped in the standard fashion for a chest or neck tunneled dialysis catheter placement. Under ultrasound guidance, the right internal jugular vein was cannulated with the 18 gauge needle.  A J-wire was then placed down into the inferior vena cava under fluoroscopic guidance.  The wire was then secured in place with a clamp to the drapes.  I then made stab incisions at the neck and exit sites.   I dissected from the exit site to the cannulation site with a tunneler.  The skin tract and venotomy was dilated serially with dilators.  The dilator-sheath was then placed under fluoroscopic guidance into the superior vena cava.  The dilator and wire were removed.  A 19cm Diatek catheter was placed under fluoroscopic guidance down into the  right atrium.  The sheath was broken and peeled away while holding the catheter cuff at the level of the skin.  The back end of this catheter was transected, and docked onto the tunneler.  The distal catheter was delivered through the subcutaneous tunnel. The catheter collar was then snapped into place.  Each port was tested by aspirating and flushing.  No resistance was noted.  Each port was then thoroughly flushed with heparinized saline.  The catheter was secured in placed with two interrupted stitches of 3-0 Nylon tied to the catheter.  The neck incision was closed with a U-stitch of 4-0 Monocryl.  The neck and chest incision were cleaned and sterile bandages applied.  Each port was then loaded with concentrated heparin (1000 Units/mL) at the manufacturer recommended volumes to each port.  Sterile caps were applied to each port.  On completion fluoroscopy, the tips of the catheter were in the right atrium, and there was no evidence of pneumothorax. All counts were correct at completion.   COMPLICATIONS: none  Tennelle Taflinger C. Randie Heinz, MD [facility] [place] Office: [phone] Pager: [phone]   04/07/2017, 10:44 AM

## 2017-04-07 NOTE — Progress Notes (Signed)
Dr Mal Amabile here to see pt & EKG.  He will updated cardiology re A. Fib. No new orders, OK to TX pt back to rm after CXR done.

## 2017-04-07 NOTE — Addendum Note (Signed)
Addendum  created 04/07/17 1137 by Geraldo Docker, CRNA   Charge Capture section accepted

## 2017-04-07 NOTE — Progress Notes (Signed)
  Progress Note    04/07/2017 9:10 AM Day of Surgery  Subjective: no new complaints  There were no vitals filed for this visit.  Physical Exam: aaox3 Non labored respirations   CBC    Component Value Date/Time   WBC 12.7 (H) 04/06/2017 1555   RBC 4.06 (L) 04/06/2017 1555   HGB 11.7 (L) 04/06/2017 1555   HGB 13.9 11/24/2015 1015   HCT 39.0 04/06/2017 1555   HCT 43.2 11/24/2015 1015   PLT 104 (L) 04/06/2017 1555   PLT 229 11/24/2015 1015   MCV 96.1 04/06/2017 1555   MCV 93 11/24/2015 1015   MCH 28.8 04/06/2017 1555   MCHC 30.0 04/06/2017 1555   RDW 15.6 (H) 04/06/2017 1555   RDW 14.5 11/24/2015 1015   LYMPHSABS 2.3 04/03/2017 1036   MONOABS 0.8 04/03/2017 1036   EOSABS 0.0 04/03/2017 1036   BASOSABS 0.0 04/03/2017 1036    BMET    Component Value Date/Time   NA 143 04/06/2017 1943   NA 142 11/24/2015 1015   K 4.9 04/06/2017 1943   CL 106 04/06/2017 1943   CO2 29 04/06/2017 1943   GLUCOSE 238 (H) 04/06/2017 1943   BUN 67 (H) 04/06/2017 1943   BUN 24 11/24/2015 1015   CREATININE 2.58 (H) 04/06/2017 1943   CALCIUM 8.1 (L) 04/06/2017 1943   CALCIUM 8.3 (L) 04/04/2017 1920   GFRNONAA 20 (L) 04/06/2017 1943   GFRAA 23 (L) 04/06/2017 1943    INR    Component Value Date/Time   INR 1.6 (H) 01/19/2007 0610    No intake or output data in the 24 hours ending 04/07/17 0910   Assessment:  83 y.o. male is in need of hd access  Plan: Tunneled dialysis catheter in OR today    Brandon C. Randie Heinz, MD [facility] [place] Office: [phone] Pager: [phone]  04/07/2017 9:10 AM

## 2017-04-07 NOTE — Transfer of Care (Signed)
Immediate Anesthesia Transfer of Care Note  Patient: Jay Walton  Procedure(s) Performed: PLACEMENT PALINDROME CATHETER - RIGHT INTERNAL JUGULAR PLACEMENT (N/A )  Patient Location: PACU  Anesthesia Type:General  Level of Consciousness: awake, alert , oriented and patient cooperative  Airway & Oxygen Therapy: Patient Spontanous Breathing and Patient connected to nasal cannula oxygen  Post-op Assessment: Report given to RN, Post -op Vital signs reviewed and stable and Patient moving all extremities X 4  Post vital signs: Reviewed and stable  Last Vitals: There were no vitals filed for this visit.  Last Pain: There were no vitals filed for this visit.    Patients Stated Pain Goal: 3 (04/07/17 0856)  Complications: No apparent anesthesia complications

## 2017-04-08 ENCOUNTER — Encounter (HOSPITAL_COMMUNITY): Payer: Self-pay | Admitting: Vascular Surgery

## 2017-04-08 DIAGNOSIS — I480 Paroxysmal atrial fibrillation: Secondary | ICD-10-CM

## 2017-04-08 LAB — CBC
HCT: 43.6 % (ref 39.0–52.0)
HEMOGLOBIN: 12.8 g/dL — AB (ref 13.0–17.0)
MCH: 28.6 pg (ref 26.0–34.0)
MCHC: 29.4 g/dL — ABNORMAL LOW (ref 30.0–36.0)
MCV: 97.3 fL (ref 78.0–100.0)
Platelets: 113 10*3/uL — ABNORMAL LOW (ref 150–400)
RBC: 4.48 MIL/uL (ref 4.22–5.81)
RDW: 15.5 % (ref 11.5–15.5)
WBC: 16.2 10*3/uL — ABNORMAL HIGH (ref 4.0–10.5)

## 2017-04-08 LAB — RENAL FUNCTION PANEL
ANION GAP: 4 — AB (ref 5–15)
Albumin: 2.4 g/dL — ABNORMAL LOW (ref 3.5–5.0)
BUN: 63 mg/dL — ABNORMAL HIGH (ref 6–20)
CALCIUM: 8.2 mg/dL — AB (ref 8.9–10.3)
CHLORIDE: 105 mmol/L (ref 101–111)
CO2: 31 mmol/L (ref 22–32)
Creatinine, Ser: 2.5 mg/dL — ABNORMAL HIGH (ref 0.61–1.24)
GFR calc Af Amer: 24 mL/min — ABNORMAL LOW (ref >60)
GFR calc non Af Amer: 21 mL/min — ABNORMAL LOW (ref >60)
Glucose, Bld: 98 mg/dL (ref 65–99)
Phosphorus: 3.5 mg/dL (ref 2.5–4.6)
Potassium: 4.6 mmol/L (ref 3.5–5.1)
SODIUM: 140 mmol/L (ref 135–145)

## 2017-04-08 NOTE — Progress Notes (Signed)
   Tunneled catheter working well. Available as needed.   Latwan Luchsinger C. Randie Heinz, MD [facility] [place] Office: [phone] Pager: [phone]

## 2017-04-08 NOTE — Progress Notes (Signed)
[facility]  ROUNDING NOTE   Subjective:  Patient seen and evaluated during hemodialysis. Right IJ PermCath has been placed.   Objective:  Vital signs in last 24 hours:  Temperature 96.5 pulse 82 respirations 18 blood pressure 145/68   Physical Exam: General: Chronically ill appearing  Head: Normocephalic, atraumatic. Moist oral mucosal membranes  Eyes: Anicteric  Neck: Supple, trachea midline  Lungs:  Clear to auscultation, normal effort  Heart: S1S2 no rubs  Abdomen:  Soft, nontender, bowel sounds present  Extremities: 1+ peripheral edema.  Neurologic: Awake, alert, following commands  Skin: Hyperkeratosis b/l LE's noted, bilateral LE rubor as well  Access: Temporary right femoral dialysis catheter, R IJ PC    Basic Metabolic Panel:  Recent Labs Lab 04/04/17 0621  04/05/17 0638 04/06/17 0443 04/06/17 1943 04/08/17 0529  NA 145  --  141 143 143 140  K 4.4  --  4.1 4.3 4.9 4.6  CL 104  --  105 105 106 105  CO2 31  --  GLUCOSE 97  --  115* 107* 238* 98  BUN 101*  --  66* 80* 67* 63*  CREATININE 3.24*  --  2.40* 3.05* 2.58* 2.50*  CALCIUM 8.3*  < > 8.1* 8.1* 8.1* 8.2*  MG 2.4  --   --  2.2  --   --   PHOS  --   --  4.2 3.1 3.4 3.5  < > = values in this interval not displayed.  Liver Function Tests:  Recent Labs Lab 04/03/17 1036 04/05/17 0638 04/06/17 0443 04/06/17 1943 04/07/17 2215 04/08/17 0529  AST 29  --   --   --  32  --   ALT 22  --   --   --  32  --   ALKPHOS 48  --   --   --  49  --   BILITOT 0.9  --   --   --  0.6  --   PROT 5.6*  --   --   --  5.3*  --   ALBUMIN 2.9* 2.4* 2.4* 2.4* 2.6* 2.4*   No results for input(s): LIPASE, AMYLASE in the last 168 hours. No results for input(s): AMMONIA in the last 168 hours.  CBC:  Recent Labs Lab 04/03/17 1036  04/05/17 0638 04/06/17 0443 04/06/17 1555 04/07/17 2215 04/08/17 0529  WBC 16.3*  < > 12.8* 15.0* 12.7* 12.8* 16.2*  NEUTROABS 13.2*  --   --   --   --   --    --   HGB 14.4  < > 13.9 12.4* 11.7* 12.2* 12.8*  HCT 46.7  < > 45.6 40.4 39.0 41.1 43.6  MCV 92.7  < > 95.6 95.3 96.1 97.2 97.3  PLT 170  < > 100* 118* 104* 110* 113*  < > = values in this interval not displayed.  Cardiac Enzymes: No results for input(s): CKTOTAL, CKMB, CKMBINDEX, TROPONINI in the last 168 hours.  BNP: Invalid input(s): POCBNP  CBG:  Recent Labs Lab 04/07/17 0903 04/07/17 1048  GLUCAP 126* 122*    Microbiology: Results for orders placed or performed during the hospital encounter of 04/02/17  Culture, respiratory (NON-Expectorated)     Status: None (Preliminary result)   Collection Time: 04/05/17 11:52 PM  Result Value Ref Range Status   Specimen Description TRACHEAL ASPIRATE  Final   Special Requests NONE  Final   Gram Stain   Final    ABUNDANT WBC PRESENT,BOTH PMN  AND MONONUCLEAR RARE SQUAMOUS EPITHELIAL CELLS PRESENT FEW GRAM NEGATIVE RODS FEW GRAM POSITIVE COCCI FEW YEAST RARE GRAM POSITIVE RODS    Culture CULTURE REINCUBATED FOR BETTER GROWTH  Final   Report Status PENDING  Incomplete  Culture, Urine     Status: None   Collection Time: 04/06/17  4:01 AM  Result Value Ref Range Status   Specimen Description URINE, RANDOM  Final   Special Requests NONE  Final   Culture NO GROWTH  Final   Report Status 04/07/2017 FINAL  Final    Coagulation Studies:  Recent Labs  04/07/17 2215  LABPROT 12.9  INR 0.98    Urinalysis:  Recent Labs  04/06/17 0401  COLORURINE YELLOW  LABSPEC 1.016  PHURINE 5.0  GLUCOSEU NEGATIVE  HGBUR LARGE*  BILIRUBINUR NEGATIVE  KETONESUR NEGATIVE  PROTEINUR NEGATIVE  NITRITE NEGATIVE  LEUKOCYTESUR SMALL*      Imaging: Dg Chest Port 1 View  Result Date: 04/07/2017 CLINICAL DATA:  Status post dialysis catheter placement EXAM: PORTABLE CHEST 1 VIEW COMPARISON:  Portable chest x-ray of April 02, 2017 FINDINGS: The lungs are adequately inflated. The interstitial markings are increased similar to that seen  previously. The cardiac silhouette is enlarged. There small bilateral pleural effusions. The dual-lumen dialysis catheter tip projects over the midportion of the SVC. There is no postprocedure pneumothorax. There is calcification in the wall of the aortic arch. The observed bony thorax exhibits no acute abnormalities. IMPRESSION: CHF with mild interstitial edema and small bilateral pleural effusions. No postprocedure complication following dialysis catheter placement. Thoracic aortic atherosclerosis. Electronically Signed   By: David  [place] M.D.   On: 04/07/2017 11:34   Dg Fluoro Guide Cv Line-no Report  Result Date: 04/07/2017 Fluoroscopy was utilized by the requesting physician.  No radiographic interpretation.     Medications:       Assessment/ Plan:  83 y.o. male with a PMHx of COPD, diabetes mellitus type 2, GERD, aortic stenosis, hypertension, osteoporosis, history of TIA, who was admitted to [facility] on 04/02/2017 for ongoing treatment of recent acute respiratory failure, COPD exacerbation, pneumonia, and acute kidney injury.   1. Acute renal failure secondary to acute tubular necrosis requiring dialysis. Patient seen and evaluated during hemodialysis. We will continue dialysis on a Monday, Wednesday, and Friday schedule for now. We will continue to look for signs of renal recovery.  2. Acute respiratory failure/bacterial pneumonia. Still has an elevated WBC count at 16.2. Antibiotic therapy as per hospitalist.  3. Secondary hyperparathyroidism.  Phosphorus currently 3.5 and acceptable. Continue to  Monitor.     LOS: 0 Annaleese Guier 10/5/20188:05 AM

## 2017-04-08 NOTE — Progress Notes (Signed)
Progress Note  Patient Name: Jay Walton Date of Encounter: 04/08/2017  Primary Cardiologist: Nahser  Subjective   No complaints getting dialysis  Inpatient Medications    Scheduled Meds:  Continuous Infusions: . sodium chloride     PRN Meds:    Vital Signs    Vitals:   04/07/17 1130 04/07/17 1137 04/07/17 1138 04/07/17 1145  BP:      Pulse:      Resp:  19 18   Temp:    (!) 97 F (36.1 C)  SpO2: 95%   94%    Intake/Output Summary (Last 24 hours) at 04/08/17 0815 Last data filed at 04/07/17 1100  Gross per 24 hour  Intake              330 ml  Output              310 ml  Net               20 ml   There were no vitals filed for this visit.  Telemetry    afib rate 90's  - Personally Reviewed  ECG    afib rate 91 PVC  - Personally Reviewed  Physical Exam  Edenulous elderly white male  GEN: No acute distress.   Neck: No JVD new dialysis catheter right subclavian Cardiac: RRR, SEM murmurs, rubs, or gallops.  Respiratory: Clear to auscultation bilaterally. GI: Soft, nontender, non-distended  MS: No edema; No deformity. Neuro:  Nonfocal  Psych: Normal affect  Old right Femoral dialysis catheter   Labs    Chemistry Recent Labs Lab 04/03/17 1036  04/06/17 0443 04/06/17 1943 04/07/17 2215 04/08/17 0529  NA 141  < > 143 143  --  140  K 4.6  < > 4.3 4.9  --  4.6  CL 101  < > 105 106  --  105  CO2 26  < > 29 29  --  31  GLUCOSE 153*  < > 107* 238*  --  98  BUN 99*  < > 80* 67*  --  63*  CREATININE 3.40*  < > 3.05* 2.58*  --  2.50*  CALCIUM 8.3*  < > 8.1* 8.1*  --  8.2*  PROT 5.6*  --   --   --  5.3*  --   ALBUMIN 2.9*  < > 2.4* 2.4* 2.6* 2.4*  AST 29  --   --   --  32  --   ALT 22  --   --   --  32  --   ALKPHOS 48  --   --   --  49  --   BILITOT 0.9  --   --   --  0.6  --   GFRNONAA 14*  < > 17* 20*  --  21*  GFRAA 17*  < > 19* 23*  --  24*  ANIONGAP 14  < > 9 8  --  4*  < > = values in this interval not displayed.   Hematology Recent  Labs Lab 04/06/17 1555 04/07/17 2215 04/08/17 0529  WBC 12.7* 12.8* 16.2*  RBC 4.06* 4.23 4.48  HGB 11.7* 12.2* 12.8*  HCT 39.0 41.1 43.6  MCV 96.1 97.2 97.3  MCH 28.8 28.8 28.6  MCHC 30.0 29.7* 29.4*  RDW 15.6* 15.8* 15.5  PLT 104* 110* 113*    Cardiac EnzymesNo results for input(s): TROPONINI in the last 168 hours. No results for input(s): TROPIPOC in the last 168  hours.   BNPNo results for input(s): BNP, PROBNP in the last 168 hours.   DDimer No results for input(s): DDIMER in the last 168 hours.   Radiology    Dg Chest Port 1 View  Result Date: 04/07/2017 CLINICAL DATA:  Status post dialysis catheter placement EXAM: PORTABLE CHEST 1 VIEW COMPARISON:  Portable chest x-ray of April 02, 2017 FINDINGS: The lungs are adequately inflated. The interstitial markings are increased similar to that seen previously. The cardiac silhouette is enlarged. There small bilateral pleural effusions. The dual-lumen dialysis catheter tip projects over the midportion of the SVC. There is no postprocedure pneumothorax. There is calcification in the wall of the aortic arch. The observed bony thorax exhibits no acute abnormalities. IMPRESSION: CHF with mild interstitial edema and small bilateral pleural effusions. No postprocedure complication following dialysis catheter placement. Thoracic aortic atherosclerosis. Electronically Signed   By: David  [place] M.D.   On: 04/07/2017 11:34   Dg Fluoro Guide Cv Line-no Report  Result Date: 04/07/2017 Fluoroscopy was utilized by the requesting physician.  No radiographic interpretation.    Cardiac Studies   Echo mild AS EF 55-60%  Patient Profile     83 y.o. male HTN, HLD Aortic Stenosis COPD CKD recent HCAP with ATN and acute respiratory failure now on hemodialysis PAF througout course. Asymptomatic started on coumadin 04/07/17 CHA2VASC 6  Assessment & Plan    PAF: primary goal rate control and anticoagulation coumadin started yesterday rates ok this  am AS:  No need to repeat echo given age and stability ATN:  Needs old Right Femoral dialysis catheter removed New right Subclavian one working well this am  For questions or updates, please contact [facility] Please consult [email or web address] for contact info under Cardiology/STEMI.      Signed, Charlton Haws, MD  04/08/2017, 8:15 AM

## 2017-04-09 LAB — CULTURE, RESPIRATORY W GRAM STAIN

## 2017-04-09 LAB — PROTIME-INR
INR: 1.02
PROTHROMBIN TIME: 13.3 s (ref 11.4–15.2)

## 2017-04-09 LAB — CULTURE, RESPIRATORY

## 2017-04-09 NOTE — Progress Notes (Signed)
Progress Note  Patient Name: Jay Walton Date of Encounter: 04/09/2017  Primary Cardiologist: Nahser  Subjective   Doing well eating breakfast   Inpatient Medications    Scheduled Meds:  Continuous Infusions: . sodium chloride     PRN Meds:    Vital Signs    Vitals:   04/07/17 1130 04/07/17 1137 04/07/17 1138 04/07/17 1145  BP:      Pulse:      Resp:  19 18   Temp:    (!) 97 F (36.1 C)  SpO2: 95%   94%   No intake or output data in the 24 hours ending 04/09/17 1121 There were no vitals filed for this visit.  Telemetry    afib rate 88-112 04/09/2017   - Personally Reviewed  ECG    afib rate 91 PVC  - Personally Reviewed  Physical Exam  Edenulous elderly white male  GEN: No acute distress.   Neck: No JVD new dialysis catheter right subclavian Cardiac: AS murmur  Respiratory: Clear to auscultation bilaterally. GI: Soft, nontender, non-distended  MS: No edema; No deformity. Neuro:  Nonfocal  Psych: Normal affect  Old right Femoral dialysis catheter finally removed   Labs    Chemistry Recent Labs Lab 04/03/17 1036  04/06/17 0443 04/06/17 1943 04/07/17 2215 04/08/17 0529  NA 141  < > 143 143  --  140  K 4.6  < > 4.3 4.9  --  4.6  CL 101  < > 105 106  --  105  CO2 26  < > 29 29  --  31  GLUCOSE 153*  < > 107* 238*  --  98  BUN 99*  < > 80* 67*  --  63*  CREATININE 3.40*  < > 3.05* 2.58*  --  2.50*  CALCIUM 8.3*  < > 8.1* 8.1*  --  8.2*  PROT 5.6*  --   --   --  5.3*  --   ALBUMIN 2.9*  < > 2.4* 2.4* 2.6* 2.4*  AST 29  --   --   --  32  --   ALT 22  --   --   --  32  --   ALKPHOS 48  --   --   --  49  --   BILITOT 0.9  --   --   --  0.6  --   GFRNONAA 14*  < > 17* 20*  --  21*  GFRAA 17*  < > 19* 23*  --  24*  ANIONGAP 14  < > 9 8  --  4*  < > = values in this interval not displayed.   Hematology  Recent Labs Lab 04/06/17 1555 04/07/17 2215 04/08/17 0529  WBC 12.7* 12.8* 16.2*  RBC 4.06* 4.23 4.48  HGB 11.7* 12.2* 12.8*  HCT  39.0 41.1 43.6  MCV 96.1 97.2 97.3  MCH 28.8 28.8 28.6  MCHC 30.0 29.7* 29.4*  RDW 15.6* 15.8* 15.5  PLT 104* 110* 113*    Cardiac EnzymesNo results for input(s): TROPONINI in the last 168 hours. No results for input(s): TROPIPOC in the last 168 hours.   BNPNo results for input(s): BNP, PROBNP in the last 168 hours.   DDimer No results for input(s): DDIMER in the last 168 hours.   Radiology    Dg Chest Port 1 View  Result Date: 04/07/2017 CLINICAL DATA:  Status post dialysis catheter placement EXAM: PORTABLE CHEST 1 VIEW COMPARISON:  Portable chest x-ray  of April 02, 2017 FINDINGS: The lungs are adequately inflated. The interstitial markings are increased similar to that seen previously. The cardiac silhouette is enlarged. There small bilateral pleural effusions. The dual-lumen dialysis catheter tip projects over the midportion of the SVC. There is no postprocedure pneumothorax. There is calcification in the wall of the aortic arch. The observed bony thorax exhibits no acute abnormalities. IMPRESSION: CHF with mild interstitial edema and small bilateral pleural effusions. No postprocedure complication following dialysis catheter placement. Thoracic aortic atherosclerosis. Electronically Signed   By: David  [place] M.D.   On: 04/07/2017 11:34    Cardiac Studies   Echo mild AS EF 55-60%  Patient Profile     83 y.o. male HTN, HLD Aortic Stenosis COPD CKD recent HCAP with ATN and acute respiratory failure now on hemodialysis PAF througout course. Asymptomatic started on coumadin 04/07/17 CHA2VASC 6  Assessment & Plan    PAF: primary goal rate control and anticoagulation back on coumadin  AS:  No need to repeat echo given age and stability ATN:  RF dialysis catheter removed sight ok  Subclavian one working well this am  Will sign off   For questions or updates, please contact [facility] Please consult [email or web address] for contact info under Cardiology/STEMI.       Signed, Charlton Haws, MD  04/09/2017, 11:21 AM

## 2017-04-10 LAB — PROTIME-INR
INR: 1.33
Prothrombin Time: 16.4 seconds — ABNORMAL HIGH (ref 11.4–15.2)

## 2017-04-11 ENCOUNTER — Other Ambulatory Visit: Payer: Self-pay | Admitting: *Deleted

## 2017-04-11 LAB — RENAL FUNCTION PANEL
Albumin: 2.3 g/dL — ABNORMAL LOW (ref 3.5–5.0)
Anion gap: 8 (ref 5–15)
BUN: 53 mg/dL — AB (ref 6–20)
CHLORIDE: 102 mmol/L (ref 101–111)
CO2: 30 mmol/L (ref 22–32)
Calcium: 8.3 mg/dL — ABNORMAL LOW (ref 8.9–10.3)
Creatinine, Ser: 2.46 mg/dL — ABNORMAL HIGH (ref 0.61–1.24)
GFR calc Af Amer: 25 mL/min — ABNORMAL LOW (ref >60)
GFR, EST NON AFRICAN AMERICAN: 21 mL/min — AB (ref >60)
Glucose, Bld: 99 mg/dL (ref 65–99)
POTASSIUM: 5 mmol/L (ref 3.5–5.1)
Phosphorus: 3.4 mg/dL (ref 2.5–4.6)
Sodium: 140 mmol/L (ref 135–145)

## 2017-04-11 LAB — PROTIME-INR
INR: 1.64
PROTHROMBIN TIME: 19.3 s — AB (ref 11.4–15.2)

## 2017-04-11 LAB — CBC
HEMATOCRIT: 39.1 % (ref 39.0–52.0)
HEMOGLOBIN: 11.6 g/dL — AB (ref 13.0–17.0)
MCH: 28.5 pg (ref 26.0–34.0)
MCHC: 29.7 g/dL — ABNORMAL LOW (ref 30.0–36.0)
MCV: 96.1 fL (ref 78.0–100.0)
Platelets: 103 10*3/uL — ABNORMAL LOW (ref 150–400)
RBC: 4.07 MIL/uL — AB (ref 4.22–5.81)
RDW: 15.2 % (ref 11.5–15.5)
WBC: 14.4 10*3/uL — ABNORMAL HIGH (ref 4.0–10.5)

## 2017-04-11 NOTE — Progress Notes (Signed)
[facility]  ROUNDING NOTE   Subjective:  Right internal jugular PermCath was placed. However this is now nonfunctional. Dialysis nurse stated that we can not push or pull from the catheter.   Objective:  Vital signs in last 24 hours:  Temperature 98.4 pulse 76 respirations 22 blood pressure 180/72   Physical Exam: General: Chronically ill appearing  Head: Normocephalic, atraumatic. Moist oral mucosal membranes  Eyes: Anicteric  Neck: Supple, trachea midline  Lungs:  Clear to auscultation, normal effort  Heart: S1S2 no rubs  Abdomen:  Soft, nontender, bowel sounds present  Extremities: 1+ peripheral edema.  Neurologic: Awake, alert, following commands  Skin: Hyperkeratosis b/l LE's noted, bilateral LE rubor as well  Access: Right internal jugular PermCath noted    Basic Metabolic Panel:  Recent Labs Lab 04/05/17 0638 04/06/17 0443 04/06/17 1943 04/08/17 0529 04/11/17 0644  NA 141 143 143 140 140  K 4.1 4.3 4.9 4.6 5.0  CL 105 105 106 105 102  CO2 GLUCOSE 115* 107* 238* 98 99  BUN 66* 80* 67* 63* 53*  CREATININE 2.40* 3.05* 2.58* 2.50* 2.46*  CALCIUM 8.1* 8.1* 8.1* 8.2* 8.3*  MG  --  2.2  --   --   --   PHOS 4.2 3.1 3.4 3.5 3.4    Liver Function Tests:  Recent Labs Lab 04/06/17 0443 04/06/17 1943 04/07/17 2215 04/08/17 0529 04/11/17 0644  AST  --   --  32  --   --   ALT  --   --  32  --   --   ALKPHOS  --   --  49  --   --   BILITOT  --   --  0.6  --   --   PROT  --   --  5.3*  --   --   ALBUMIN 2.4* 2.4* 2.6* 2.4* 2.3*   No results for input(s): LIPASE, AMYLASE in the last 168 hours. No results for input(s): AMMONIA in the last 168 hours.  CBC:  Recent Labs Lab 04/06/17 0443 04/06/17 1555 04/07/17 2215 04/08/17 0529 04/11/17 0644  WBC 15.0* 12.7* 12.8* 16.2* 14.4*  HGB 12.4* 11.7* 12.2* 12.8* 11.6*  HCT 40.4 39.0 41.1 43.6 39.1  MCV 95.3 96.1 97.2 97.3 96.1  PLT 118* 104* 110* 113* 103*    Cardiac  Enzymes: No results for input(s): CKTOTAL, CKMB, CKMBINDEX, TROPONINI in the last 168 hours.  BNP: Invalid input(s): POCBNP  CBG:  Recent Labs Lab 04/07/17 0903 04/07/17 1048  GLUCAP 126* 122*    Microbiology: Results for orders placed or performed during the hospital encounter of 04/02/17  Culture, respiratory (NON-Expectorated)     Status: None   Collection Time: 04/05/17 11:52 PM  Result Value Ref Range Status   Specimen Description TRACHEAL ASPIRATE  Final   Special Requests NONE  Final   Gram Stain   Final    ABUNDANT WBC PRESENT,BOTH PMN AND MONONUCLEAR RARE SQUAMOUS EPITHELIAL CELLS PRESENT FEW GRAM NEGATIVE RODS FEW GRAM POSITIVE COCCI FEW YEAST RARE GRAM POSITIVE RODS    Culture MODERATE ACHROMOBACTER XYLOSOXIDANS  Final   Report Status 04/09/2017 FINAL  Final   Organism ID, Bacteria ACHROMOBACTER XYLOSOXIDANS  Final      Susceptibility   Achromobacter xylosoxidans - MIC*    CEFEPIME >=64 RESISTANT Resistant     CEFAZOLIN >=64 RESISTANT Resistant     GENTAMICIN >=16 RESISTANT Resistant     CIPROFLOXACIN >=4 RESISTANT Resistant  IMIPENEM 1 SENSITIVE Sensitive     TRIMETH/SULFA <=20 SENSITIVE Sensitive     * MODERATE ACHROMOBACTER XYLOSOXIDANS  Culture, Urine     Status: None   Collection Time: 04/06/17  4:01 AM  Result Value Ref Range Status   Specimen Description URINE, RANDOM  Final   Special Requests NONE  Final   Culture NO GROWTH  Final   Report Status 04/07/2017 FINAL  Final    Coagulation Studies:  Recent Labs  04/09/17 0549 04/10/17 0606 04/11/17 0644  LABPROT 13.3 16.4* 19.3*  INR 1.02 1.33 1.64    Urinalysis: No results for input(s): COLORURINE, LABSPEC, PHURINE, GLUCOSEU, HGBUR, BILIRUBINUR, KETONESUR, PROTEINUR, UROBILINOGEN, NITRITE, LEUKOCYTESUR in the last 72 hours.  Invalid input(s): APPERANCEUR    Imaging: No results found.   Medications:       Assessment/ Plan:  83 y.o. male with a PMHx of COPD, diabetes  mellitus type 2, GERD, aortic stenosis, hypertension, osteoporosis, history of TIA, who was admitted to [facility] on 04/02/2017 for ongoing treatment of recent acute respiratory failure, COPD exacerbation, pneumonia, and acute kidney injury.   1. Acute renal failure secondary to acute tubular necrosis requiring dialysis. The patient did have a partial dialysis treatment today. Unfortunately his PermCath is now nonfunctional and we could not push or pull from the catheter. Therefore we will go ahead and consult with vascular surgery again regarding this issue.  2. Acute respiratory failure/bacterial pneumonia. Management as per hospitalist.  3. Secondary hyperparathyroidism.  Phosphorus at target at 3.4. Continue Renvela.     LOS: 0 Burgess Sheriff 10/8/201811:51 AM

## 2017-04-11 NOTE — Consult Note (Signed)
Hospital Consult    Reason for Consult:  Clotted TDC Requesting Physician:  Lateef MRN #:  [id number]  History of Present Illness: This is a 83 y.o. male with ESRD in [facility].  Last week, we were consulted for access.  A TDC was placed on 04/07/17 and initially it was working well.  Today, they were unable to use the TDC and consulted [facility].    His underlying reason for admission to [facility] was a respiratory issue followed by ATN.  Other medical problems include COPD, diabetes, hyperlipidemia and hypertension all of which are stable.  He has had no prior neck operations or central lines that he can recall.  Echocardiogram performed February, 2016 reveals heavily calcified aortic valve but with only mild aortic stenosis. He has normal left ventricle systolic function with an EF of 55-60%.  Last seen by Dr. Nahser 01/2016 after 5 years for pre-operative evaluation. Cleared without any intervention.  His INR today is 1.64.  Past Medical History:  Diagnosis Date  . Aortic stenosis   . Bronchitis   . Chronic cough   . Chronic kidney disease   . COPD (chronic obstructive pulmonary disease) (HCC)   . Diabetes (HCC)   . DJD (degenerative joint disease)   . Edema   . GERD (gastroesophageal reflux disease)   . History of TIAs   . Hyperlipidemia   . Hypertension   . Peripheral neuropathy   . Vitamin D deficiency     Past Surgical History:  Procedure Laterality Date  . ANKLE SURGERY    . HERNIA REPAIR    . INSERTION OF DIALYSIS CATHETER N/A 04/07/2017   Procedure: PLACEMENT PALINDROME CATHETER - RIGHT INTERNAL JUGULAR PLACEMENT;  Surgeon: Alexie Lanni Christopher, MD;  Location: [facility] OR;  Service: Vascular;  Laterality: N/A;  . REPLACEMENT TOTAL KNEE    . US ECHOCARDIOGRAPHY  06/14/2007   EF 55-60%    Allergies  Allergen Reactions  . Ace Inhibitors     COUGH  . Esomeprazole Magnesium Swelling    Prior to Admission medications   Medication Sig Start Date End Date Taking? Authorizing  Provider  albuterol (PROVENTIL HFA;VENTOLIN HFA) 108 (90 Base) MCG/ACT inhaler Inhale 2 puffs into the lungs every 6 (six) hours as needed for wheezing or shortness of breath. 01/14/16   Mannam, Praveen, MD  Cholecalciferol (VITAMIN D PO) Take 2,000 Units by mouth daily.     [provider]  fluticasone furoate-vilanterol (BREO ELLIPTA) 100-25 MCG/INH AEPB Inhale 1 puff into the lungs daily. 01/14/16   Mannam, Praveen, MD  furosemide (LASIX) 20 MG tablet Take 20 mg by mouth daily. Reported on 01/14/2016 07/24/14   [provider]  gabapentin (NEURONTIN) 300 MG capsule One po twice during the day, 1-2 tabs at night 11/24/15   Yan, Yijun, MD  losartan (COZAAR) 100 MG tablet Take 100 mg by mouth daily. 07/24/14 01/14/16  [provider]    Social History   Social History  . Marital status: Married    Spouse name: N/A  . Number of children: 2  . Years of education: 13   Occupational History  . Retired    Social History Main Topics  . Smoking status: Former Smoker    Packs/day: 1.00    Years: 57.00    Types: Cigarettes    Quit date: 05/12/1996  . Smokeless tobacco: Former User  . Alcohol use 0.0 oz/week     Comment: beer or wine once daily  . Drug use: No  . Sexual   activity: Not on file   Other Topics Concern  . Not on file   Social History Narrative   Lives at home alone (son stays with him often).   Right-handed.   3-4 cups caffeine per day.     Family History  Problem Relation Age of Onset  . Pneumonia Mother   . Pneumonia Father     ROS: [x] Positive   [ ] Negative   [ ] All sytems reviewed and are negative  Cardiac: [] chest pain/pressure [] palpitations [] SOB lying flat [] DOE [x] hx Afib [x] hx mild AS  Vascular: [] pain in legs while walking [] pain in legs at rest [] pain in legs at night [] non-healing ulcers [] hx of DVT [] swelling in legs  Pulmonary: [] productive cough [] asthma/wheezing [] home O2 [x]  COPD  Neurologic: [] weakness in [] arms [] legs [] numbness in [] arms [] legs [] hx of CVA [x] mini stroke []difficulty speaking or slurred speech [] temporary loss of vision in one eye [] dizziness  Hematologic: [] hx of cancer [] bleeding problems [] problems with blood clotting easily  Endocrine:   [x] diabetes [] thyroid disease  GI [] vomiting blood [] blood in stool [x] GERD  GU: [x] CKD/renal failure [x] HD--[] M/W/F or [] T/T/S [] burning with urination [] blood in urine  Psychiatric: [] anxiety [] depression  Musculoskeletal: [] arthritis [] joint pain [x] DDD  Integumentary: [] rashes [] ulcers  Constitutional: [] fever [] chills   Physical Examination  Vitals:   04/07/17 1138 04/07/17 1145  BP:    Pulse:    Resp: 18   Temp:  (!) 97 F (36.1 C)  SpO2:  94%   There is no height or weight on file to calculate BMI.  General:  WDWN in NAD Gait: Not observed HENT: WNL, normocephalic Pulmonary: normal non-labored breathing, without Rales, rhonchi,  wheezing Cardiac: regular, without  Murmurs, rubs or gallops Abdomen:  soft, NT/ND, no masses Skin: without rashes Vascular Exam/Pulses:  Right Left  Radial 2+ (normal) 2+ (normal)  DP 1+ (weak) 1+ (weak)   Extremities: without ischemic changes, without Gangrene , without cellulitis; without open wounds;  Musculoskeletal: no muscle wasting or atrophy  Neurologic: A&O X 3;  No focal weakness or paresthesias are detected; speech is fluent/normal Psychiatric:  The pt has Normal affect.   CBC    Component Value Date/Time   WBC 14.4 (H) 04/11/2017 0644   RBC 4.07 (L) 04/11/2017 0644   HGB 11.6 (L) 04/11/2017 0644   HGB 13.9 11/24/2015 1015   HCT 39.1 04/11/2017 0644   HCT 43.2 11/24/2015 1015   PLT 103 (L) 04/11/2017 0644   PLT 229 11/24/2015 1015   MCV 96.1 04/11/2017 0644   MCV 93 11/24/2015 1015   MCH 28.5 04/11/2017 0644   MCHC 29.7 (L) 04/11/2017 0644   RDW 15.2 04/11/2017 0644    RDW 14.5 11/24/2015 1015   LYMPHSABS 2.3 04/03/2017 1036   MONOABS 0.8 04/03/2017 1036   EOSABS 0.0 04/03/2017 1036   BASOSABS 0.0 04/03/2017 1036    BMET    Component Value Date/Time   NA 140 04/11/2017 0644   NA 142 11/24/2015 1015   K 5.0 04/11/2017 0644   CL 102 04/11/2017 0644   CO2 30 04/11/2017 0644   GLUCOSE 99 04/11/2017 0644   BUN 53 (H) 04/11/2017 0644   BUN 24 11/24/2015 1015   CREATININE 2.46 (  H) 04/11/2017 0644   CALCIUM 8.3 (L) 04/11/2017 0644   CALCIUM 8.3 (L) 04/04/2017 1920   GFRNONAA 21 (L) 04/11/2017 0644   GFRAA 25 (L) 04/11/2017 0644    COAGS: Lab Results  Component Value Date   INR 1.64 04/11/2017   INR 1.33 04/10/2017   INR 1.02 04/09/2017     Non-Invasive Vascular Imaging:   none  Statin:  Yes.   Beta Blocker:  No. Aspirin:  No. ACEI:  No. ARB:  No. CCB use:  No Other antiplatelets/anticoagulants:  Yes.   SQ heparin   ASSESSMENT/PLAN: This is a 83 y.o. male with ESRD who recently had a TDC placed on 04/07/17 by Dr. Hrishikesh Hoeg that is now non functional   -will plan to take to OR tomorrow to exchange TDC -will get INR in the morning as it was 1.64 today -npo after MN and consent.   Samantha Rhyne, PA-C [facility] [phone]  I have independently interviewed and examined patient and agree with PA assessment and plan above. Will hold coumadin and check inr in the morning. Npo past midnight and will plan to replace tomorrow.   Tanairi Cypert C. Soma Bachand, MD [facility] [place] Office: [phone] Pager: [phone]   

## 2017-04-12 ENCOUNTER — Encounter (HOSPITAL_COMMUNITY): Payer: Medicare Other | Admitting: Certified Registered Nurse Anesthetist

## 2017-04-12 ENCOUNTER — Other Ambulatory Visit (HOSPITAL_COMMUNITY): Payer: Medicare Other

## 2017-04-12 ENCOUNTER — Inpatient Hospital Stay
Admission: AD | Admit: 2017-04-12 | Discharge: 2017-06-04 | Disposition: E | Payer: Self-pay | Source: Ambulatory Visit | Attending: Internal Medicine | Admitting: Internal Medicine

## 2017-04-12 ENCOUNTER — Encounter: Payer: Self-pay | Admitting: Certified Registered Nurse Anesthetist

## 2017-04-12 ENCOUNTER — Encounter (HOSPITAL_COMMUNITY)
Admission: EM | Disposition: A | Discharge status: Other Acute Inpatient Hospital | Payer: Self-pay | Source: Ambulatory Visit

## 2017-04-12 ENCOUNTER — Ambulatory Visit (HOSPITAL_COMMUNITY): Admission: RE | Admit: 2017-04-12 | Payer: Medicare Other | Source: Ambulatory Visit | Admitting: Vascular Surgery

## 2017-04-12 DIAGNOSIS — Z992 Dependence on renal dialysis: Secondary | ICD-10-CM

## 2017-04-12 DIAGNOSIS — J90 Pleural effusion, not elsewhere classified: Secondary | ICD-10-CM

## 2017-04-12 DIAGNOSIS — J811 Chronic pulmonary edema: Secondary | ICD-10-CM

## 2017-04-12 DIAGNOSIS — N186 End stage renal disease: Secondary | ICD-10-CM | POA: Diagnosis not present

## 2017-04-12 DIAGNOSIS — R0602 Shortness of breath: Secondary | ICD-10-CM

## 2017-04-12 DIAGNOSIS — Z9889 Other specified postprocedural states: Secondary | ICD-10-CM

## 2017-04-12 HISTORY — PX: EXCHANGE OF A DIALYSIS CATHETER: SHX5818

## 2017-04-12 LAB — POCT I-STAT 4, (NA,K, GLUC, HGB,HCT)
GLUCOSE: 124 mg/dL — AB (ref 65–99)
HCT: 33 % — ABNORMAL LOW (ref 39.0–52.0)
Hemoglobin: 11.2 g/dL — ABNORMAL LOW (ref 13.0–17.0)
Potassium: 5.2 mmol/L — ABNORMAL HIGH (ref 3.5–5.1)
SODIUM: 139 mmol/L (ref 135–145)

## 2017-04-12 LAB — PROTIME-INR
INR: 1.91
PROTHROMBIN TIME: 21.7 s — AB (ref 11.4–15.2)

## 2017-04-12 LAB — GLUCOSE, CAPILLARY: GLUCOSE-CAPILLARY: 119 mg/dL — AB (ref 65–99)

## 2017-04-12 SURGERY — EXCHANGE OF A DIALYSIS CATHETER
Anesthesia: Monitor Anesthesia Care | Site: Chest | Laterality: Right

## 2017-04-12 MED ORDER — HEPARIN SODIUM (PORCINE) 1000 UNIT/ML IJ SOLN
INTRAMUSCULAR | Status: DC | PRN
  Administered 2017-04-12: 3.4 [IU] via INTRAVENOUS

## 2017-04-12 MED ORDER — LIDOCAINE HCL 1 % IJ SOLN
INTRAMUSCULAR | Status: DC | PRN
  Administered 2017-04-12: 10 mL

## 2017-04-12 MED ORDER — ALBUMIN HUMAN 5 % IV SOLN
INTRAVENOUS | Status: DC | PRN
  Administered 2017-04-12: 09:00:00 via INTRAVENOUS

## 2017-04-12 MED ORDER — PHENYLEPHRINE HCL 10 MG/ML IJ SOLN
INTRAMUSCULAR | Status: DC | PRN
  Administered 2017-04-12 (×2): 120 ug via INTRAVENOUS
  Administered 2017-04-12: 160 ug via INTRAVENOUS
  Administered 2017-04-12 (×2): 120 ug via INTRAVENOUS
  Administered 2017-04-12: 160 ug via INTRAVENOUS

## 2017-04-12 MED ORDER — PROPOFOL 500 MG/50ML IV EMUL
INTRAVENOUS | Status: DC | PRN
  Administered 2017-04-12: 60 ug/kg/min via INTRAVENOUS

## 2017-04-12 MED ORDER — SODIUM CHLORIDE 0.9 % IV SOLN
INTRAVENOUS | Status: DC
  Administered 2017-04-12: 09:00:00 via INTRAVENOUS

## 2017-04-12 MED ORDER — DEXTROSE 5 % IV SOLN
1.5000 g | INTRAVENOUS | Status: AC
  Administered 2017-04-12: 1.5 g via INTRAVENOUS
  Filled 2017-04-12: qty 1.5

## 2017-04-12 MED ORDER — 0.9 % SODIUM CHLORIDE (POUR BTL) OPTIME
TOPICAL | Status: DC | PRN
  Administered 2017-04-12: 1000 mL

## 2017-04-12 MED ORDER — SODIUM CHLORIDE 0.9 % IV SOLN
INTRAVENOUS | Status: DC | PRN
  Administered 2017-04-12: 500 mL

## 2017-04-12 MED ORDER — SODIUM CHLORIDE 0.9 % IV SOLN: INTRAVENOUS | Status: DC

## 2017-04-12 SURGICAL SUPPLY — 40 items
BAG DECANTER FOR FLEXI CONT (MISCELLANEOUS) ×3 IMPLANT
BIOPATCH RED 1 DISK 7.0 (GAUZE/BANDAGES/DRESSINGS) ×2 IMPLANT
BIOPATCH RED 1IN DISK 7.0MM (GAUZE/BANDAGES/DRESSINGS) ×1
CATH PALINDROME RT-P 15FX19CM (CATHETERS) IMPLANT
CATH PALINDROME RT-P 15FX23CM (CATHETERS) ×2 IMPLANT
CATH PALINDROME RT-P 15FX28CM (CATHETERS) IMPLANT
CATH PALINDROME RT-P 15FX55CM (CATHETERS) IMPLANT
COVER PROBE W GEL 5X96 (DRAPES) ×1 IMPLANT
COVER SURGICAL LIGHT HANDLE (MISCELLANEOUS) ×3 IMPLANT
DRAPE C-ARM 42X72 X-RAY (DRAPES) ×3 IMPLANT
DRAPE CHEST BREAST 15X10 FENES (DRAPES) ×3 IMPLANT
GAUZE SPONGE 2X2 8PLY STRL LF (GAUZE/BANDAGES/DRESSINGS) IMPLANT
GAUZE SPONGE 4X4 16PLY XRAY LF (GAUZE/BANDAGES/DRESSINGS) ×3 IMPLANT
GLOVE BIO SURGEON STRL SZ7 (GLOVE) ×3 IMPLANT
GLOVE BIOGEL PI IND STRL 7.5 (GLOVE) ×1 IMPLANT
GLOVE BIOGEL PI INDICATOR 7.5 (GLOVE) ×2
GOWN STRL REUS W/ TWL LRG LVL3 (GOWN DISPOSABLE) ×3 IMPLANT
GOWN STRL REUS W/TWL LRG LVL3 (GOWN DISPOSABLE) ×6
KIT BASIN OR (CUSTOM PROCEDURE TRAY) ×3 IMPLANT
KIT ROOM TURNOVER OR (KITS) ×3 IMPLANT
NDL 18GX1X1/2 (RX/OR ONLY) (NEEDLE) ×1 IMPLANT
NDL HYPO 25GX1X1/2 BEV (NEEDLE) ×1 IMPLANT
NEEDLE 18GX1X1/2 (RX/OR ONLY) (NEEDLE) ×3 IMPLANT
NEEDLE HYPO 25GX1X1/2 BEV (NEEDLE) ×3 IMPLANT
NS IRRIG 1000ML POUR BTL (IV SOLUTION) ×3 IMPLANT
PACK SURGICAL SETUP 50X90 (CUSTOM PROCEDURE TRAY) ×1 IMPLANT
PAD ARMBOARD 7.5X6 YLW CONV (MISCELLANEOUS) ×6 IMPLANT
SOAP 2 % CHG 4 OZ (WOUND CARE) ×3 IMPLANT
SPONGE GAUZE 2X2 STER 10/PKG (GAUZE/BANDAGES/DRESSINGS) ×2
SUT ETHILON 3 0 PS 1 (SUTURE) ×3 IMPLANT
SUT MNCRL AB 4-0 PS2 18 (SUTURE) ×3 IMPLANT
SYR 10ML LL (SYRINGE) ×3 IMPLANT
SYR 20CC LL (SYRINGE) ×6 IMPLANT
SYR 30ML LL (SYRINGE) IMPLANT
SYR 3ML LL SCALE MARK (SYRINGE) ×1 IMPLANT
SYR 5ML LL (SYRINGE) ×3 IMPLANT
SYR CONTROL 10ML LL (SYRINGE) ×3 IMPLANT
TAPE CLOTH SURG 4X10 WHT LF (GAUZE/BANDAGES/DRESSINGS) ×2 IMPLANT
WATER STERILE IRR 1000ML POUR (IV SOLUTION) ×3 IMPLANT
WIRE AMPLATZ SS-J .035X180CM (WIRE) ×2 IMPLANT

## 2017-04-12 NOTE — Op Note (Signed)
OPERATIVE NOTE  PROCEDURE: 1. right internal jugular vein tunneled dialysis catheter exchange  PRE-OPERATIVE DIAGNOSIS: end-stage renal failure  POST-OPERATIVE DIAGNOSIS: same as above  SURGEON: Adele Barthel, MD  ANESTHESIA: local and MAC  ESTIMATED BLOOD LOSS: 30 cc  FINDING(S): 1.  Tips of the catheter in the right atrium on fluoroscopy 2.  No obvious pneumothorax on fluoroscopy  SPECIMEN(S):  none  INDICATIONS:   Jay Walton is a 83 y.o. male who presents with non-functioning tunneled dialysis catheter.  The patient presents for tunneled dialysis catheter exchange.  The patient is aware the risks of tunneled dialysis catheter placement include but are not limited to: bleeding, infection, central venous injury, pneumothorax, possible venous stenosis, possible malpositioning in the venous system, and possible infections related to long-term catheter presence.  The patient was aware of these risks and agreed to proceed.  DESCRIPTION: After written full informed consent was obtained from the patient, the patient was taken back to the operating room.  Prior to induction, the patient was given IV antibiotics.  After obtaining adequate sedation, the patient was prepped and draped in the standard fashion for a chest or neck tunneled dialysis catheter placement.    First, I turned my attention to the external segment of the prior tunneled dialysis catheter.  I injected 10 cc of 1% lidocaine without epinephrine around the cuff of the tunneled dialysis catheter to obtain anesthesia.  Then using blunt dissection, I freed the cuff of the catheter .  Second, I turned my attention to the patient's right neck, where I could palpate the prior tunneled dialysis catheter.  I made a stab incision and then dissected out bluntly the prior tunneled dialysis catheter.  I pulled the catheter up to the skin, clamped the catheter, and then transected the catheter revealing the two lumens of the catheter.   The external end was removed and passed off the field.  Third, I clamped one lumen of the catheter and then loaded a Amplatz wire into the remaining lumen and advanced the wire into the inferior vena cava under fluoroscopic guidance.  I exchanged the remaining segment of tunneled dialysis catheter for the dilator-sheath.  The dilator was removed.   Fourth, A 23 cm Palindrome tunneled dialysis catheter was loaded through the sheath and advanced into the right atrium under fluoroscopic guidance.    Fifth, I made a new exit site on the chest with a 11-blade.  I dissected with the metal tunneler from the new exit site to the neck incision.  I clamped the back end of the Palindrome catheter, and then transected the catheter revealing the lumens of the catheter.  One lumen of the catheter was loaded onto the metal tunneler   The catheter was pulled with the metal tunneler through the subcutaneous tunnel.  I reclamped the catheter and the transected the back end of the catheter a second time, releasing the metal tunneler.  The catheter collar was loaded over the back end of the catheter.  The two ports were docked onto the two exposed catheter lumens.  The catheter collar was then clicked into place.    Finally, each port was tested by aspirating and flushing.  No resistance was noted.  Each port was then thoroughly flushed with heparinized saline.  The catheter was secured in placed with two interrupted stitches of 3-0 Nylon tied to the catheter.  The neck incision was closed with a U-stitch of 4-0 Monocryl.  The neck and chest incision were cleaned  and sterile bandages applied.  Each port was then loaded with concentrated heparin (1000 Units/mL) at the manufacturer recommended volumes to each port.  Sterile caps were applied to each port.    On completion fluoroscopy, the tips of the catheter were in the right atrium, and there was no evidence of pneumothorax.   COMPLICATIONS: none  CONDITION:  stable   Adele Barthel, MD, [facility] [place] Office: [phone] Pager: [phone]  04/14/2017, 9:32 AM

## 2017-04-12 NOTE — Transfer of Care (Signed)
Immediate Anesthesia Transfer of Care Note  Patient: Jay Walton  Procedure(s) Performed: EXCHANGE OF A DIALYSIS CATHETER (Right Chest)  Patient Location: PACU  Anesthesia Type:MAC  Level of Consciousness: awake, alert , oriented and patient cooperative  Airway & Oxygen Therapy: Patient Spontanous Breathing and Patient connected to nasal cannula oxygen  Post-op Assessment: Report given to RN and Post -op Vital signs reviewed and stable  Post vital signs: Reviewed and stable  Last Vitals:  Vitals:   04/07/17 1138 04/07/17 1145  BP:    Pulse:    Resp: 18   Temp:  (!) 36.1 C  SpO2:  94%    Last Pain:  Vitals:   04/07/17 1145  PainSc: 0-No pain      Patients Stated Pain Goal: 3 (80/99/83 3825)  Complications: No apparent anesthesia complications

## 2017-04-12 NOTE — H&P (View-Only) (Signed)
Hospital Consult    Reason for Consult:  Clotted [facility] Requesting Physician:  Cherylann Ratel MRN #:  [id number]  History of Present Illness: This is a 83 y.o. male with ESRD in [facility].  Last week, we were consulted for access.  A TDC was placed on 04/07/17 and initially it was working well.  Today, they were unable to use the [facility] and consulted [facility].    His underlying reason for admission to [facility] was a respiratory issue followed by ATN.  Other medical problems include COPD, diabetes, hyperlipidemia and hypertension all of which are stable.  He has had no prior neck operations or central lines that he can recall.  Echocardiogram performed February, 2016 reveals heavily calcified aortic valve but with only mild aortic stenosis. He has normal left ventricle systolic function with an EF of 55-60%.  Last seen by Dr. Elease Hashimoto 01/2016 after 5 years for pre-operative evaluation. Cleared without any intervention.  His INR today is 1.64.  Past Medical History:  Diagnosis Date  . Aortic stenosis   . Bronchitis   . Chronic cough   . Chronic kidney disease   . COPD (chronic obstructive pulmonary disease) (HCC)   . Diabetes (HCC)   . DJD (degenerative joint disease)   . Edema   . GERD (gastroesophageal reflux disease)   . History of TIAs   . Hyperlipidemia   . Hypertension   . Peripheral neuropathy   . Vitamin D deficiency     Past Surgical History:  Procedure Laterality Date  . ANKLE SURGERY    . HERNIA REPAIR    . INSERTION OF DIALYSIS CATHETER N/A 04/07/2017   Procedure: PLACEMENT PALINDROME CATHETER - RIGHT INTERNAL JUGULAR PLACEMENT;  Surgeon: Maeola Harman, MD;  Location: [facility];  Service: Vascular;  Laterality: N/A;  . REPLACEMENT TOTAL KNEE    . US ECHOCARDIOGRAPHY  06/14/2007   EF 55-60%    Allergies  Allergen Reactions  . Ace Inhibitors     COUGH  . Esomeprazole Magnesium Swelling    Prior to Admission medications   Medication Sig Start Date End Date Taking? Authorizing  Provider  albuterol (PROVENTIL HFA;VENTOLIN HFA) 108 (90 Base) MCG/ACT inhaler Inhale 2 puffs into the lungs every 6 (six) hours as needed for wheezing or shortness of breath. 01/14/16   Mannam, Colbert Coyer, MD  Cholecalciferol (VITAMIN D PO) Take 2,000 Units by mouth daily.     [provider]  fluticasone furoate-vilanterol (BREO ELLIPTA) 100-25 MCG/INH AEPB Inhale 1 puff into the lungs daily. 01/14/16   Mannam, Colbert Coyer, MD  furosemide (LASIX) 20 MG tablet Take 20 mg by mouth daily. Reported on 01/14/2016 07/24/14   [provider]  gabapentin (NEURONTIN) 300 MG capsule One po twice during the day, 1-2 tabs at night 11/24/15   Levert Feinstein, MD  losartan (COZAAR) 100 MG tablet Take 100 mg by mouth daily. 07/24/14 01/14/16  [provider]    Social History   Social History  . Marital status: Married    Spouse name: N/A  . Number of children: 2  . Years of education: 13   Occupational History  . Retired    Social History Main Topics  . Smoking status: Former Smoker    Packs/day: 1.00    Years: 57.00    Types: Cigarettes    Quit date: 05/12/1996  . Smokeless tobacco: Former Neurosurgeon  . Alcohol use 0.0 oz/week     Comment: beer or wine once daily  . Drug use: No  . Sexual  activity: Not on file   Other Topics Concern  . Not on file   Social History Narrative   Lives at home alone (son stays with him often).   Right-handed.   3-4 cups caffeine per day.     Family History  Problem Relation Age of Onset  . Pneumonia Mother   . Pneumonia Father     ROS:  Positive    Negative    All sytems reviewed and are negative  Cardiac:  chest pain/pressure  palpitations  SOB lying flat  DOE  hx Afib  hx mild AS  Vascular:  pain in legs while walking  pain in legs at rest  pain in legs at night  non-healing ulcers  hx of DVT  swelling in legs  Pulmonary:  productive cough  asthma/wheezing  home O2   COPD  Neurologic:  weakness in  arms  legs  numbness in  arms  legs  hx of CVA  mini stroke difficulty speaking or slurred speech  temporary loss of vision in one eye  dizziness  Hematologic:  hx of cancer  bleeding problems  problems with blood clotting easily  Endocrine:    diabetes  thyroid disease  GI  vomiting blood  blood in stool  GERD  GU:  CKD/renal failure  HD--[]  M/W/F or  T/T/S  burning with urination  blood in urine  Psychiatric:  anxiety  depression  Musculoskeletal:  arthritis  joint pain  DDD  Integumentary:  rashes  ulcers  Constitutional:  fever  chills   Physical Examination  Vitals:   04/07/17 1138 04/07/17 1145  BP:    Pulse:    Resp: 18   Temp:  (!) 97 F (36.1 C)  SpO2:  94%   There is no height or weight on file to calculate BMI.  General:  WDWN in NAD Gait: Not observed HENT: WNL, normocephalic Pulmonary: normal non-labored breathing, without Rales, rhonchi,  wheezing Cardiac: regular, without  Murmurs, rubs or gallops Abdomen:  soft, NT/ND, no masses Skin: without rashes Vascular Exam/Pulses:  Right Left  Radial 2+ (normal) 2+ (normal)  DP 1+ (weak) 1+ (weak)   Extremities: without ischemic changes, without Gangrene , without cellulitis; without open wounds;  Musculoskeletal: no muscle wasting or atrophy  Neurologic: A&O X 3;  No focal weakness or paresthesias are detected; speech is fluent/normal Psychiatric:  The pt has Normal affect.   CBC    Component Value Date/Time   WBC 14.4 (H) 04/11/2017 0644   RBC 4.07 (L) 04/11/2017 0644   HGB 11.6 (L) 04/11/2017 0644   HGB 13.9 11/24/2015 1015   HCT 39.1 04/11/2017 0644   HCT 43.2 11/24/2015 1015   PLT 103 (L) 04/11/2017 0644   PLT 229 11/24/2015 1015   MCV 96.1 04/11/2017 0644   MCV 93 11/24/2015 1015   MCH 28.5 04/11/2017 0644   MCHC 29.7 (L) 04/11/2017 0644   RDW 15.2 04/11/2017 0644    RDW 14.5 11/24/2015 1015   LYMPHSABS 2.3 04/03/2017 1036   MONOABS 0.8 04/03/2017 1036   EOSABS 0.0 04/03/2017 1036   BASOSABS 0.0 04/03/2017 1036    BMET    Component Value Date/Time   NA 140 04/11/2017 0644   NA 142 11/24/2015 1015   K 5.0 04/11/2017 0644   CL 102 04/11/2017 0644   CO2 30 04/11/2017 0644   GLUCOSE 99 04/11/2017 0644   BUN 53 (H) 04/11/2017 0644   BUN 24 11/24/2015 1015   CREATININE 2.46 (  H) 04/11/2017 0644   CALCIUM 8.3 (L) 04/11/2017 0644   CALCIUM 8.3 (L) 04/04/2017 1920   GFRNONAA 21 (L) 04/11/2017 0644   GFRAA 25 (L) 04/11/2017 0644    COAGS: Lab Results  Component Value Date   INR 1.64 04/11/2017   INR 1.33 04/10/2017   INR 1.02 04/09/2017     Non-Invasive Vascular Imaging:   none  Statin:  Yes.   Beta Blocker:  No. Aspirin:  No. ACEI:  No. ARB:  No. CCB use:  No Other antiplatelets/anticoagulants:  Yes.   SQ heparin   ASSESSMENT/PLAN: This is a 83 y.o. male with ESRD who recently had a TDC placed on 04/07/17 by Dr. Randie Heinz that is now non functional   -will plan to take to OR tomorrow to exchange [facility] -will get INR in the morning as it was 1.64 today -npo after MN and consent.   Doreatha Massed, PA-C [facility] [phone]  I have independently interviewed and examined patient and agree with PA assessment and plan above. Will hold coumadin and check inr in the morning. Npo past midnight and will plan to replace tomorrow.   Rella Egelston C. Randie Heinz, MD [facility] [place] Office: [phone] Pager: [phone]

## 2017-04-12 NOTE — Anesthesia Procedure Notes (Signed)
Procedure Name: MAC Date/Time: 04/20/2017 8:57 AM Performed by: Salli Quarry Geovanie Winnett Pre-anesthesia Checklist: Patient identified, Emergency Drugs available, Suction available and Patient being monitored Patient Re-evaluated:Patient Re-evaluated prior to induction Oxygen Delivery Method: Simple face mask

## 2017-04-12 NOTE — Anesthesia Preprocedure Evaluation (Addendum)
Anesthesia Evaluation  Patient identified by MRN, date of birth, ID band Patient awake    Reviewed: Allergy & Precautions, NPO status , Patient's Chart, lab work & pertinent test results  Airway Mallampati: I  TM Distance: <3 FB Neck ROM: Full    Dental  (+) Edentulous Upper, Edentulous Lower   Pulmonary COPD, former smoker,    + rhonchi  + decreased breath sounds      Cardiovascular hypertension, + Valvular Problems/Murmurs AS  Rhythm:Regular Rate:Normal     Neuro/Psych  Neuromuscular disease    GI/Hepatic Neg liver ROS, GERD  ,  Endo/Other  negative endocrine ROSdiabetes  Renal/GU ESRF and DialysisRenal diseasenegative Renal ROS     Musculoskeletal  (+) Arthritis ,   Abdominal (+) + obese,   Peds  Hematology negative hematology ROS (+)   Anesthesia Other Findings Day of surgery medications reviewed with the patient.  Reproductive/Obstetrics                            Anesthesia Physical Anesthesia Plan  ASA: III  Anesthesia Plan: MAC   Post-op Pain Management:    Induction: Intravenous  PONV Risk Score and Plan: 2 and Ondansetron and Dexamethasone  Airway Management Planned: Natural Airway and Nasal Cannula  Additional Equipment:   Intra-op Plan:   Post-operative Plan: Extubation in OR  Informed Consent: I have reviewed the patients History and Physical, chart, labs and discussed the procedure including the risks, benefits and alternatives for the proposed anesthesia with the patient or authorized representative who has indicated his/her understanding and acceptance.   Dental advisory given  Plan Discussed with: CRNA  Anesthesia Plan Comments:         Anesthesia Quick Evaluation

## 2017-04-12 NOTE — Anesthesia Postprocedure Evaluation (Signed)
Anesthesia Post Note  Patient: Jay Walton  Procedure(s) Performed: EXCHANGE OF A DIALYSIS CATHETER (Right Chest)     Patient location during evaluation: PACU Anesthesia Type: MAC Level of consciousness: awake and alert Pain management: pain level controlled Vital Signs Assessment: post-procedure vital signs reviewed and stable Respiratory status: spontaneous breathing, nonlabored ventilation, respiratory function stable and patient connected to nasal cannula oxygen Cardiovascular status: stable and blood pressure returned to baseline Postop Assessment: no apparent nausea or vomiting Anesthetic complications: no    Last Vitals:  Vitals:   04/05/2017 0944 04/17/2017 1000  BP: 124/68 (!) 138/58  Pulse:  (!) 107  Resp:  (!) 21  Temp: (!) 36.4 C (!) 36.4 C  SpO2:  93%    Last Pain:  Vitals:   04/07/17 1145  PainSc: 0-No pain                 Effie Berkshire

## 2017-04-12 NOTE — Interval H&P Note (Signed)
History and Physical Interval Note:  2017/04/20 8:50 AM  Jay Walton  has presented today for surgery, with the diagnosis of end stage renal disease  The various methods of treatment have been discussed with the patient and family. After consideration of risks, benefits and other options for treatment, the patient has consented to  Procedure(s): EXCHANGE OF A DIALYSIS CATHETER (Right) as a surgical intervention .  The patient's history has been reviewed, patient examined, no change in status, stable for surgery.  I have reviewed the patient's chart and labs.  Questions were answered to the patient's satisfaction.     Leonides Sake

## 2017-04-13 ENCOUNTER — Encounter (HOSPITAL_COMMUNITY): Payer: Self-pay | Admitting: Vascular Surgery

## 2017-04-13 LAB — RENAL FUNCTION PANEL
ALBUMIN: 2.2 g/dL — AB (ref 3.5–5.0)
ANION GAP: 10 (ref 5–15)
BUN: 46 mg/dL — ABNORMAL HIGH (ref 6–20)
CALCIUM: 8.3 mg/dL — AB (ref 8.9–10.3)
CO2: 27 mmol/L (ref 22–32)
CREATININE: 2.28 mg/dL — AB (ref 0.61–1.24)
Chloride: 103 mmol/L (ref 101–111)
GFR calc non Af Amer: 23 mL/min — ABNORMAL LOW (ref >60)
GFR, EST AFRICAN AMERICAN: 27 mL/min — AB (ref >60)
GLUCOSE: 113 mg/dL — AB (ref 65–99)
PHOSPHORUS: 3.6 mg/dL (ref 2.5–4.6)
Potassium: 5.1 mmol/L (ref 3.5–5.1)
SODIUM: 140 mmol/L (ref 135–145)

## 2017-04-13 LAB — CBC
HEMATOCRIT: 36.5 % — AB (ref 39.0–52.0)
Hemoglobin: 11.1 g/dL — ABNORMAL LOW (ref 13.0–17.0)
MCH: 28.9 pg (ref 26.0–34.0)
MCHC: 30.4 g/dL (ref 30.0–36.0)
MCV: 95.1 fL (ref 78.0–100.0)
Platelets: 103 10*3/uL — ABNORMAL LOW (ref 150–400)
RBC: 3.84 MIL/uL — AB (ref 4.22–5.81)
RDW: 15.3 % (ref 11.5–15.5)
WBC: 14.2 10*3/uL — AB (ref 4.0–10.5)

## 2017-04-13 LAB — PROTIME-INR
INR: 2.3
Prothrombin Time: 25.1 seconds — ABNORMAL HIGH (ref 11.4–15.2)

## 2017-04-13 NOTE — Progress Notes (Signed)
[facility]  ROUNDING NOTE   Subjective:  Patient due for dialysis today. Orders have been prepared. Appreciate vascular surgery assistance. Right internal jugular PermCath was replaced.   Objective:  Vital signs in last 24 hours:  Temperature 98.4 pulse 76 respirations 22 blood pressure 180/72   Physical Exam: General: Chronically ill appearing  Head: Normocephalic, atraumatic. Moist oral mucosal membranes  Eyes: Anicteric  Neck: Supple, trachea midline  Lungs:  Clear to auscultation, normal effort  Heart: S1S2 no rubs  Abdomen:  Soft, nontender, bowel sounds present  Extremities: 1+ peripheral edema.  Neurologic: Awake, alert, following commands  Skin: Hyperkeratosis b/l LE's noted, bilateral LE rubor as well  Access: Right internal jugular PermCath noted    Basic Metabolic Panel:  Recent Labs Lab 04/06/17 1943 04/08/17 0529 04/11/17 0644 05/04/2017 0846 04/13/17 0707  NA 143 140 140 139 140  K 4.9 4.6 5.0 5.2* 5.1  CL 106 105 102  --  103  CO2 --  27  GLUCOSE 238* 98 99 124* 113*  BUN 67* 63* 53*  --  46*  CREATININE 2.58* 2.50* 2.46*  --  2.28*  CALCIUM 8.1* 8.2* 8.3*  --  8.3*  PHOS 3.4 3.5 3.4  --  3.6    Liver Function Tests:  Recent Labs Lab 04/06/17 1943 04/07/17 2215 04/08/17 0529 04/11/17 0644 04/13/17 0707  AST  --  32  --   --   --   ALT  --  32  --   --   --   ALKPHOS  --  49  --   --   --   BILITOT  --  0.6  --   --   --   PROT  --  5.3*  --   --   --   ALBUMIN 2.4* 2.6* 2.4* 2.3* 2.2*   No results for input(s): LIPASE, AMYLASE in the last 168 hours. No results for input(s): AMMONIA in the last 168 hours.  CBC:  Recent Labs Lab 04/06/17 1555 04/07/17 2215 04/08/17 0529 04/11/17 0644 04/11/2017 0846 04/13/17 0740  WBC 12.7* 12.8* 16.2* 14.4*  --  14.2*  HGB 11.7* 12.2* 12.8* 11.6* 11.2* 11.1*  HCT 39.0 41.1 43.6 39.1 33.0* 36.5*  MCV 96.1 97.2 97.3 96.1  --  95.1  PLT 104* 110* 113* 103*  --  103*     Cardiac Enzymes: No results for input(s): CKTOTAL, CKMB, CKMBINDEX, TROPONINI in the last 168 hours.  BNP: Invalid input(s): POCBNP  CBG:  Recent Labs Lab 04/07/17 0903 04/07/17 1048 04/17/2017 0947  GLUCAP 126* 122* 119*    Microbiology: Results for orders placed or performed during the hospital encounter of 04/02/17  Culture, respiratory (NON-Expectorated)     Status: None   Collection Time: 04/05/17 11:52 PM  Result Value Ref Range Status   Specimen Description TRACHEAL ASPIRATE  Final   Special Requests NONE  Final   Gram Stain   Final    ABUNDANT WBC PRESENT,BOTH PMN AND MONONUCLEAR RARE SQUAMOUS EPITHELIAL CELLS PRESENT FEW GRAM NEGATIVE RODS FEW GRAM POSITIVE COCCI FEW YEAST RARE GRAM POSITIVE RODS    Culture MODERATE ACHROMOBACTER XYLOSOXIDANS  Final   Report Status 04/09/2017 FINAL  Final   Organism ID, Bacteria ACHROMOBACTER XYLOSOXIDANS  Final      Susceptibility   Achromobacter xylosoxidans - MIC*    CEFEPIME >=64 RESISTANT Resistant     CEFAZOLIN >=64 RESISTANT Resistant     GENTAMICIN >=16 RESISTANT Resistant  CIPROFLOXACIN >=4 RESISTANT Resistant     IMIPENEM 1 SENSITIVE Sensitive     TRIMETH/SULFA <=20 SENSITIVE Sensitive     * MODERATE ACHROMOBACTER XYLOSOXIDANS  Culture, Urine     Status: None   Collection Time: 04/06/17  4:01 AM  Result Value Ref Range Status   Specimen Description URINE, RANDOM  Final   Special Requests NONE  Final   Culture NO GROWTH  Final   Report Status 04/07/2017 FINAL  Final    Coagulation Studies:  Recent Labs  04/11/17 0644 May 06, 2017 0616 04/13/17 0707  LABPROT 19.3* 21.7* 25.1*  INR 1.64 1.91 2.30    Urinalysis: No results for input(s): COLORURINE, LABSPEC, PHURINE, GLUCOSEU, HGBUR, BILIRUBINUR, KETONESUR, PROTEINUR, UROBILINOGEN, NITRITE, LEUKOCYTESUR in the last 72 hours.  Invalid input(s): APPERANCEUR    Imaging: Dg Chest Port 1 View  Result Date: 05-06-2017 CLINICAL DATA:  Right dialysis  catheter placement EXAM: PORTABLE CHEST 1 VIEW COMPARISON:  04/07/2017 FINDINGS: Right dialysis catheter is in place with the tip at the cavoatrial junction. No pneumothorax. There is cardiomegaly with diffuse interstitial and alveolar opacities compatible with edema/ CHF. Small layering pleural effusions. IMPRESSION: Right dialysis catheter tip at the cavoatrial junction. No pneumothorax. Moderate CHF with small bilateral effusions. Electronically Signed   By: Charlett Nose M.D.   On: 06-May-2017 09:54   Dg Fluoro Guide Cv Line-no Report  Result Date: 05/06/2017 Fluoroscopy was utilized by the requesting physician.  No radiographic interpretation.     Medications:       Assessment/ Plan:  83 y.o. male with a PMHx of COPD, diabetes mellitus type 2, GERD, aortic stenosis, hypertension, osteoporosis, history of TIA, who was admitted to [facility] on 04/02/2017 for ongoing treatment of recent acute respiratory failure, COPD exacerbation, pneumonia, and acute kidney injury.   1. Acute renal failure secondary to acute tubular necrosis requiring dialysis. The patient is due for another dialysis treatment today. We plan to complete this and plan for dialysis again on Friday as well.  Appreciate vascular surgery assistance with placement of PermCath.  2. Acute respiratory failure/bacterial pneumonia. Appears to be doing well from this perspective. Treatment as per hospitalist.  3. Secondary hyperparathyroidism.  Phosphorus rechecked this a.m. And is currently 3.6. Continue Renvela.     LOS: 0 Jay Walton 10/10/20183:49 PM

## 2017-04-14 LAB — PROTIME-INR
INR: 2.43
PROTHROMBIN TIME: 26.3 s — AB (ref 11.4–15.2)

## 2017-04-15 LAB — CBC
HCT: 38.4 % — ABNORMAL LOW (ref 39.0–52.0)
HEMOGLOBIN: 11.5 g/dL — AB (ref 13.0–17.0)
MCH: 28.4 pg (ref 26.0–34.0)
MCHC: 29.9 g/dL — AB (ref 30.0–36.0)
MCV: 94.8 fL (ref 78.0–100.0)
PLATELETS: 148 10*3/uL — AB (ref 150–400)
RBC: 4.05 MIL/uL — AB (ref 4.22–5.81)
RDW: 15.3 % (ref 11.5–15.5)
WBC: 10.5 10*3/uL (ref 4.0–10.5)

## 2017-04-15 LAB — RENAL FUNCTION PANEL
ANION GAP: 8 (ref 5–15)
Albumin: 2.3 g/dL — ABNORMAL LOW (ref 3.5–5.0)
BUN: 37 mg/dL — ABNORMAL HIGH (ref 6–20)
CALCIUM: 8.4 mg/dL — AB (ref 8.9–10.3)
CHLORIDE: 101 mmol/L (ref 101–111)
CO2: 29 mmol/L (ref 22–32)
Creatinine, Ser: 1.9 mg/dL — ABNORMAL HIGH (ref 0.61–1.24)
GFR calc non Af Amer: 29 mL/min — ABNORMAL LOW (ref >60)
GFR, EST AFRICAN AMERICAN: 34 mL/min — AB (ref >60)
GLUCOSE: 88 mg/dL (ref 65–99)
Phosphorus: 3.9 mg/dL (ref 2.5–4.6)
Potassium: 5 mmol/L (ref 3.5–5.1)
SODIUM: 138 mmol/L (ref 135–145)

## 2017-04-15 LAB — PROTIME-INR
INR: 2.82
Prothrombin Time: 29.5 seconds — ABNORMAL HIGH (ref 11.4–15.2)

## 2017-04-15 NOTE — Progress Notes (Signed)
[facility]  ROUNDING NOTE   Subjective:  Patient appears to be making more urine. he was seen and evaluated during hemodialysis today.   Objective:  Vital signs in last 24 hours:  Temperature 98 pulse 103 respirations 20 blood pressure 93/47   Physical Exam: General: Chronically ill appearing  Head: Normocephalic, atraumatic. Moist oral mucosal membranes  Eyes: Anicteric  Neck: Supple, trachea midline  Lungs:  Clear to auscultation, normal effort  Heart: S1S2 no rubs  Abdomen:  Soft, nontender, bowel sounds present  Extremities: 1+ peripheral edema.  Neurologic: Awake, alert, following commands  Skin: Hyperkeratosis b/l LE's noted, bilateral LE rubor as well  Access: Right internal jugular PermCath noted    Basic Metabolic Panel:  Recent Labs Lab 04/11/17 0644 05/04/2017 0846 04/13/17 0707 04/15/17 0748  NA 140 139 140 138  K 5.0 5.2* 5.1 5.0  CL 102  --  103 101  CO2 30  --  27 29  GLUCOSE 99 124* 113* 88  BUN 53*  --  46* 37*  CREATININE 2.46*  --  2.28* 1.90*  CALCIUM 8.3*  --  8.3* 8.4*  PHOS 3.4  --  3.6 3.9    Liver Function Tests:  Recent Labs Lab 04/11/17 0644 04/13/17 0707 04/15/17 0748  ALBUMIN 2.3* 2.2* 2.3*   No results for input(s): LIPASE, AMYLASE in the last 168 hours. No results for input(s): AMMONIA in the last 168 hours.  CBC:  Recent Labs Lab 04/11/17 0644 04/09/2017 0846 04/13/17 0740 04/15/17 0748  WBC 14.4*  --  14.2* 10.5  HGB 11.6* 11.2* 11.1* 11.5*  HCT 39.1 33.0* 36.5* 38.4*  MCV 96.1  --  95.1 94.8  PLT 103*  --  103* 148*    Cardiac Enzymes: No results for input(s): CKTOTAL, CKMB, CKMBINDEX, TROPONINI in the last 168 hours.  BNP: Invalid input(s): POCBNP  CBG:  Recent Labs Lab 04/14/2017 0947  GLUCAP 119*    Microbiology: Results for orders placed or performed during the hospital encounter of 04/02/17  Culture, respiratory (NON-Expectorated)     Status: None   Collection Time: 04/05/17 11:52  PM  Result Value Ref Range Status   Specimen Description TRACHEAL ASPIRATE  Final   Special Requests NONE  Final   Gram Stain   Final    ABUNDANT WBC PRESENT,BOTH PMN AND MONONUCLEAR RARE SQUAMOUS EPITHELIAL CELLS PRESENT FEW GRAM NEGATIVE RODS FEW GRAM POSITIVE COCCI FEW YEAST RARE GRAM POSITIVE RODS    Culture MODERATE ACHROMOBACTER XYLOSOXIDANS  Final   Report Status 04/09/2017 FINAL  Final   Organism ID, Bacteria ACHROMOBACTER XYLOSOXIDANS  Final      Susceptibility   Achromobacter xylosoxidans - MIC*    CEFEPIME >=64 RESISTANT Resistant     CEFAZOLIN >=64 RESISTANT Resistant     GENTAMICIN >=16 RESISTANT Resistant     CIPROFLOXACIN >=4 RESISTANT Resistant     IMIPENEM 1 SENSITIVE Sensitive     TRIMETH/SULFA <=20 SENSITIVE Sensitive     * MODERATE ACHROMOBACTER XYLOSOXIDANS  Culture, Urine     Status: None   Collection Time: 04/06/17  4:01 AM  Result Value Ref Range Status   Specimen Description URINE, RANDOM  Final   Special Requests NONE  Final   Culture NO GROWTH  Final   Report Status 04/07/2017 FINAL  Final    Coagulation Studies:  Recent Labs  04/13/17 0707 04/14/17 0623 04/15/17 0748  LABPROT 25.1* 26.3* 29.5*  INR 2.30 2.43 2.82    Urinalysis: No results for input(s):  COLORURINE, LABSPEC, PHURINE, GLUCOSEU, HGBUR, BILIRUBINUR, KETONESUR, PROTEINUR, UROBILINOGEN, NITRITE, LEUKOCYTESUR in the last 72 hours.  Invalid input(s): APPERANCEUR    Imaging: No results found.   Medications:       Assessment/ Plan:  83 y.o. male with a PMHx of COPD, diabetes mellitus type 2, GERD, aortic stenosis, hypertension, osteoporosis, history of TIA, who was admitted to [facility] on 04/02/2017 for ongoing treatment of recent acute respiratory failure, COPD exacerbation, pneumonia, and acute kidney injury.   1. Acute renal failure secondary to acute tubular necrosis requiring dialysis. It appears that the patient is experiencing renal recovery. His  creatinine is down to 1.9. Better urine output noted as well. Therefore we will hold off on any further dialysis for now. Reevaluate him for the possibility of renal placement therapy on Monday.  2. Acute respiratory failure/bacterial pneumonia. Continues to be doing quite well. Antibiotic therapy as per hospitalist.  3. Secondary hyperparathyroidism.  Phosphorus currently 3.9 and acceptable. Continue to monitor.    LOS: 0 Caroly Purewal 10/12/20186:30 PM

## 2017-04-16 LAB — PROTIME-INR
INR: 2.97
Prothrombin Time: 30.7 seconds — ABNORMAL HIGH (ref 11.4–15.2)

## 2017-04-17 LAB — PROTIME-INR
INR: 2.24
PROTHROMBIN TIME: 24.6 s — AB (ref 11.4–15.2)

## 2017-04-18 LAB — CBC
HCT: 37.5 % — ABNORMAL LOW (ref 39.0–52.0)
HEMOGLOBIN: 11.2 g/dL — AB (ref 13.0–17.0)
MCH: 28.4 pg (ref 26.0–34.0)
MCHC: 29.9 g/dL — AB (ref 30.0–36.0)
MCV: 94.9 fL (ref 78.0–100.0)
Platelets: 220 10*3/uL (ref 150–400)
RBC: 3.95 MIL/uL — ABNORMAL LOW (ref 4.22–5.81)
RDW: 15.4 % (ref 11.5–15.5)
WBC: 10.5 10*3/uL (ref 4.0–10.5)

## 2017-04-18 LAB — PROTIME-INR
INR: 2.28
PROTHROMBIN TIME: 24.9 s — AB (ref 11.4–15.2)

## 2017-04-18 NOTE — Progress Notes (Signed)
[facility]  ROUNDING NOTE   Subjective:  No new renal function testing available today.  patient still making good urine output however. Patient resting complained bed.   Objective:  Vital signs in last 24 hours:  Temperature 97.3 pulse 86 respirations 14 blood pressure 127/69   Physical Exam: General: Chronically ill appearing  Head: Normocephalic, atraumatic. Moist oral mucosal membranes  Eyes: Anicteric  Neck: Supple, trachea midline  Lungs:  Clear to auscultation, normal effort  Heart: S1S2 no rubs  Abdomen:  Soft, nontender, bowel sounds present  Extremities: 1+ peripheral edema.  Neurologic: Awake, alert, following commands  Skin: Hyperkeratosis b/l LE's noted, bilateral LE rubor as well  Access: Right internal jugular PermCath noted    Basic Metabolic Panel:  Recent Labs Lab 04/23/2017 0846 04/13/17 0707 04/15/17 0748  NA 139 140 138  K 5.2* 5.1 5.0  CL  --  103 101  CO2  --  27 29  GLUCOSE 124* 113* 88  BUN  --  46* 37*  CREATININE  --  2.28* 1.90*  CALCIUM  --  8.3* 8.4*  PHOS  --  3.6 3.9    Liver Function Tests:  Recent Labs Lab 04/13/17 0707 04/15/17 0748  ALBUMIN 2.2* 2.3*   No results for input(s): LIPASE, AMYLASE in the last 168 hours. No results for input(s): AMMONIA in the last 168 hours.  CBC:  Recent Labs Lab 04/30/2017 0846 04/13/17 0740 04/15/17 0748 04/18/17 0543  WBC  --  14.2* 10.5 10.5  HGB 11.2* 11.1* 11.5* 11.2*  HCT 33.0* 36.5* 38.4* 37.5*  MCV  --  95.1 94.8 94.9  PLT  --  103* 148* 220    Cardiac Enzymes: No results for input(s): CKTOTAL, CKMB, CKMBINDEX, TROPONINI in the last 168 hours.  BNP: Invalid input(s): POCBNP  CBG:  Recent Labs Lab 04/22/2017 0947  GLUCAP 119*    Microbiology: Results for orders placed or performed during the hospital encounter of 04/02/17  Culture, respiratory (NON-Expectorated)     Status: None   Collection Time: 04/05/17 11:52 PM  Result Value Ref Range Status    Specimen Description TRACHEAL ASPIRATE  Final   Special Requests NONE  Final   Gram Stain   Final    ABUNDANT WBC PRESENT,BOTH PMN AND MONONUCLEAR RARE SQUAMOUS EPITHELIAL CELLS PRESENT FEW GRAM NEGATIVE RODS FEW GRAM POSITIVE COCCI FEW YEAST RARE GRAM POSITIVE RODS    Culture MODERATE ACHROMOBACTER XYLOSOXIDANS  Final   Report Status 04/09/2017 FINAL  Final   Organism ID, Bacteria ACHROMOBACTER XYLOSOXIDANS  Final      Susceptibility   Achromobacter xylosoxidans - MIC*    CEFEPIME >=64 RESISTANT Resistant     CEFAZOLIN >=64 RESISTANT Resistant     GENTAMICIN >=16 RESISTANT Resistant     CIPROFLOXACIN >=4 RESISTANT Resistant     IMIPENEM 1 SENSITIVE Sensitive     TRIMETH/SULFA <=20 SENSITIVE Sensitive     * MODERATE ACHROMOBACTER XYLOSOXIDANS  Culture, Urine     Status: None   Collection Time: 04/06/17  4:01 AM  Result Value Ref Range Status   Specimen Description URINE, RANDOM  Final   Special Requests NONE  Final   Culture NO GROWTH  Final   Report Status 04/07/2017 FINAL  Final    Coagulation Studies:  Recent Labs  04/16/17 0648 04/17/17 0543 04/18/17 0543  LABPROT 30.7* 24.6* 24.9*  INR 2.97 2.24 2.28    Urinalysis: No results for input(s): COLORURINE, LABSPEC, PHURINE, GLUCOSEU, HGBUR, BILIRUBINUR, KETONESUR, PROTEINUR, UROBILINOGEN, NITRITE,  LEUKOCYTESUR in the last 72 hours.  Invalid input(s): APPERANCEUR    Imaging: No results found.   Medications:       Assessment/ Plan:  83 y.o. male with a PMHx of COPD, diabetes mellitus type 2, GERD, aortic stenosis, hypertension, osteoporosis, history of TIA, who was admitted to [facility] on 04/02/2017 for ongoing treatment of recent acute respiratory failure, COPD exacerbation, pneumonia, and acute kidney injury.   1. Acute renal failure secondary to acute tubular necrosis requiring dialysis. - No new renal function testing available today. We will reorder renal function testing for tomorrow as  well as Wednesday. Good urine output noted.  2. Acute respiratory failure/bacterial pneumonia. Management as per hospitalist.  3. Secondary hyperparathyroidism.  Phosphorus 3.9 at last check. Recommend rechecking phosphorus tomorrow.   LOS: 0 Analy Walton 10/15/20184:00 PM

## 2017-04-19 LAB — RENAL FUNCTION PANEL
ALBUMIN: 2.4 g/dL — AB (ref 3.5–5.0)
Anion gap: 6 (ref 5–15)
BUN: 50 mg/dL — AB (ref 6–20)
CALCIUM: 8.5 mg/dL — AB (ref 8.9–10.3)
CHLORIDE: 104 mmol/L (ref 101–111)
CO2: 29 mmol/L (ref 22–32)
CREATININE: 1.87 mg/dL — AB (ref 0.61–1.24)
GFR, EST AFRICAN AMERICAN: 35 mL/min — AB (ref >60)
GFR, EST NON AFRICAN AMERICAN: 30 mL/min — AB (ref >60)
Glucose, Bld: 107 mg/dL — ABNORMAL HIGH (ref 65–99)
Phosphorus: 3.5 mg/dL (ref 2.5–4.6)
Potassium: 5.6 mmol/L — ABNORMAL HIGH (ref 3.5–5.1)
SODIUM: 139 mmol/L (ref 135–145)

## 2017-04-19 LAB — CBC
HCT: 36.8 % — ABNORMAL LOW (ref 39.0–52.0)
HEMOGLOBIN: 11.1 g/dL — AB (ref 13.0–17.0)
MCH: 28.6 pg (ref 26.0–34.0)
MCHC: 30.2 g/dL (ref 30.0–36.0)
MCV: 94.8 fL (ref 78.0–100.0)
PLATELETS: 242 10*3/uL (ref 150–400)
RBC: 3.88 MIL/uL — AB (ref 4.22–5.81)
RDW: 15.2 % (ref 11.5–15.5)
WBC: 11.2 10*3/uL — AB (ref 4.0–10.5)

## 2017-04-19 LAB — MAGNESIUM: MAGNESIUM: 1.7 mg/dL (ref 1.7–2.4)

## 2017-04-20 LAB — RENAL FUNCTION PANEL
Albumin: 2.4 g/dL — ABNORMAL LOW (ref 3.5–5.0)
Anion gap: 8 (ref 5–15)
BUN: 52 mg/dL — AB (ref 6–20)
CHLORIDE: 105 mmol/L (ref 101–111)
CO2: 26 mmol/L (ref 22–32)
Calcium: 8.4 mg/dL — ABNORMAL LOW (ref 8.9–10.3)
Creatinine, Ser: 1.78 mg/dL — ABNORMAL HIGH (ref 0.61–1.24)
GFR calc Af Amer: 37 mL/min — ABNORMAL LOW (ref >60)
GFR, EST NON AFRICAN AMERICAN: 32 mL/min — AB (ref >60)
Glucose, Bld: 123 mg/dL — ABNORMAL HIGH (ref 65–99)
POTASSIUM: 5.8 mmol/L — AB (ref 3.5–5.1)
Phosphorus: 4.4 mg/dL (ref 2.5–4.6)
Sodium: 139 mmol/L (ref 135–145)

## 2017-04-20 LAB — CBC
HCT: 37.7 % — ABNORMAL LOW (ref 39.0–52.0)
Hemoglobin: 11.7 g/dL — ABNORMAL LOW (ref 13.0–17.0)
MCH: 29.6 pg (ref 26.0–34.0)
MCHC: 31 g/dL (ref 30.0–36.0)
MCV: 95.4 fL (ref 78.0–100.0)
PLATELETS: 273 10*3/uL (ref 150–400)
RBC: 3.95 MIL/uL — AB (ref 4.22–5.81)
RDW: 15.7 % — AB (ref 11.5–15.5)
WBC: 9.7 10*3/uL (ref 4.0–10.5)

## 2017-04-20 LAB — PROTIME-INR
INR: 1.35
PROTHROMBIN TIME: 16.6 s — AB (ref 11.4–15.2)

## 2017-04-20 LAB — MAGNESIUM: Magnesium: 1.6 mg/dL — ABNORMAL LOW (ref 1.7–2.4)

## 2017-04-20 NOTE — Progress Notes (Signed)
[facility]  ROUNDING NOTE   Subjective:  Potassium noted to be high in the past 2 days. Next line potassium currently 5.8. Next line patient given Kayexalate. Creatinine down to 1.7. Patient was also getting oranges.   Objective:  Vital signs in last 24 hours:  Temperature 96.3 pulse 98 respirations 17 blood pressure 142/81   Physical Exam: General: Chronically ill appearing  Head: Normocephalic, atraumatic. Moist oral mucosal membranes  Eyes: Anicteric  Neck: Supple, trachea midline  Lungs:  Clear to auscultation, normal effort  Heart: S1S2 no rubs  Abdomen:  Soft, nontender, bowel sounds present  Extremities: 1+ peripheral edema.  Neurologic: Awake, alert, following commands  Skin: Hyperkeratosis b/l LE's noted, bilateral LE rubor as well  Access: Right internal jugular PermCath noted    Basic Metabolic Panel:  Recent Labs Lab 04/15/17 0748 04/19/17 0537 04/20/17 0620  NA 138 139 139  K 5.0 5.6* 5.8*  CL 101 104 105  CO2 29 29 26   GLUCOSE 88 107* 123*  BUN 37* 50* 52*  CREATININE 1.90* 1.87* 1.78*  CALCIUM 8.4* 8.5* 8.4*  MG  --  1.7 1.6*  PHOS 3.9 3.5 4.4    Liver Function Tests:  Recent Labs Lab 04/15/17 0748 04/19/17 0537 04/20/17 0620  ALBUMIN 2.3* 2.4* 2.4*   No results for input(s): LIPASE, AMYLASE in the last 168 hours. No results for input(s): AMMONIA in the last 168 hours.  CBC:  Recent Labs Lab 04/15/17 0748 04/18/17 0543 04/19/17 0537 04/20/17 0620  WBC 10.5 10.5 11.2* 9.7  HGB 11.5* 11.2* 11.1* 11.7*  HCT 38.4* 37.5* 36.8* 37.7*  MCV 94.8 94.9 94.8 95.4  PLT 148* 220 242 273    Cardiac Enzymes: No results for input(s): CKTOTAL, CKMB, CKMBINDEX, TROPONINI in the last 168 hours.  BNP: Invalid input(s): POCBNP  CBG: No results for input(s): GLUCAP in the last 168 hours.  Microbiology: Results for orders placed or performed during the hospital encounter of 04/02/17  Culture, respiratory (NON-Expectorated)      Status: None   Collection Time: 04/05/17 11:52 PM  Result Value Ref Range Status   Specimen Description TRACHEAL ASPIRATE  Final   Special Requests NONE  Final   Gram Stain   Final    ABUNDANT WBC PRESENT,BOTH PMN AND MONONUCLEAR RARE SQUAMOUS EPITHELIAL CELLS PRESENT FEW GRAM NEGATIVE RODS FEW GRAM POSITIVE COCCI FEW YEAST RARE GRAM POSITIVE RODS    Culture MODERATE ACHROMOBACTER XYLOSOXIDANS  Final   Report Status 04/09/2017 FINAL  Final   Organism ID, Bacteria ACHROMOBACTER XYLOSOXIDANS  Final      Susceptibility   Achromobacter xylosoxidans - MIC*    CEFEPIME >=64 RESISTANT Resistant     CEFAZOLIN >=64 RESISTANT Resistant     GENTAMICIN >=16 RESISTANT Resistant     CIPROFLOXACIN >=4 RESISTANT Resistant     IMIPENEM 1 SENSITIVE Sensitive     TRIMETH/SULFA <=20 SENSITIVE Sensitive     * MODERATE ACHROMOBACTER XYLOSOXIDANS  Culture, Urine     Status: None   Collection Time: 04/06/17  4:01 AM  Result Value Ref Range Status   Specimen Description URINE, RANDOM  Final   Special Requests NONE  Final   Culture NO GROWTH  Final   Report Status 04/07/2017 FINAL  Final    Coagulation Studies:  Recent Labs  04/18/17 0543 04/20/17 0620  LABPROT 24.9* 16.6*  INR 2.28 1.35    Urinalysis: No results for input(s): COLORURINE, LABSPEC, PHURINE, GLUCOSEU, HGBUR, BILIRUBINUR, KETONESUR, PROTEINUR, UROBILINOGEN, NITRITE, LEUKOCYTESUR in the  last 72 hours.  Invalid input(s): APPERANCEUR    Imaging: No results found.   Medications:       Assessment/ Plan:  83 y.o. male with a PMHx of COPD, diabetes mellitus type 2, GERD, aortic stenosis, hypertension, osteoporosis, history of TIA, who was admitted to [facility] on 04/02/2017 for ongoing treatment of recent acute respiratory failure, COPD exacerbation, pneumonia, and acute kidney injury.   1. Acute renal failure secondary to acute tubular necrosis requiring dialysis. - Kidney function continues to improve.  Creatinine down to 1.7. We will go ahead and discontinue PermCath. Consult with vascular surgery to assist with this.  2. Acute respiratory failure/bacterial pneumonia. Management as per hospitalist.  3. Secondary hyperparathyroidism.  Phosphorus 4.4 and acceptable.  4. Hyperkalemia. Serum potassium 5.8. Agree with discontinuation of orange juice. May need to give veltassa if potassium remains high.   LOS: 0 Jearldean Gutt 10/17/20183:32 PM

## 2017-04-21 DIAGNOSIS — N185 Chronic kidney disease, stage 5: Secondary | ICD-10-CM

## 2017-04-21 LAB — RENAL FUNCTION PANEL
ALBUMIN: 2.4 g/dL — AB (ref 3.5–5.0)
Anion gap: 9 (ref 5–15)
BUN: 56 mg/dL — AB (ref 6–20)
CALCIUM: 8.2 mg/dL — AB (ref 8.9–10.3)
CO2: 30 mmol/L (ref 22–32)
CREATININE: 1.67 mg/dL — AB (ref 0.61–1.24)
Chloride: 102 mmol/L (ref 101–111)
GFR calc Af Amer: 40 mL/min — ABNORMAL LOW (ref >60)
GFR, EST NON AFRICAN AMERICAN: 34 mL/min — AB (ref >60)
GLUCOSE: 102 mg/dL — AB (ref 65–99)
PHOSPHORUS: 4.1 mg/dL (ref 2.5–4.6)
POTASSIUM: 4.5 mmol/L (ref 3.5–5.1)
SODIUM: 141 mmol/L (ref 135–145)

## 2017-04-21 LAB — CBC
HEMATOCRIT: 37.7 % — AB (ref 39.0–52.0)
HEMOGLOBIN: 11.3 g/dL — AB (ref 13.0–17.0)
MCH: 28.8 pg (ref 26.0–34.0)
MCHC: 30 g/dL (ref 30.0–36.0)
MCV: 95.9 fL (ref 78.0–100.0)
Platelets: 279 10*3/uL (ref 150–400)
RBC: 3.93 MIL/uL — AB (ref 4.22–5.81)
RDW: 15.4 % (ref 11.5–15.5)
WBC: 9.3 10*3/uL (ref 4.0–10.5)

## 2017-04-21 LAB — MAGNESIUM: MAGNESIUM: 1.6 mg/dL — AB (ref 1.7–2.4)

## 2017-04-21 LAB — PROTIME-INR
INR: 1.3
PROTHROMBIN TIME: 16 s — AB (ref 11.4–15.2)

## 2017-04-21 NOTE — Progress Notes (Signed)
[facility] Catheter Removal Procedure Note  History of Present Illness: [facility] 83 [facility]  Last week, we were consulted for access.  A TDC was placed on 04/07/17 and initially it was working well.  [facility]    His underlying reason for admission to [facility] was a respiratory issue followed by [facility]. Other medical problems include COPD, diabetes, hyperlipidemia and hypertension all of which are stable. He has had no prior neck operations or central lines that he can recall.  Echocardiogram performed February, 2016 reveals heavily calcified aortic valve but with only mild aortic stenosis. He has normal left ventricle systolic function with an EF of 55-60%.  Last seen by Dr. Elease HashimotoNahser 01/2016 after 5 years for pre-operative evaluation. Cleared without any intervention.  Catheter placed on 04/11/2017.  We are now asked to remove the tunneled catheter requested by Dr. Cherylann RatelLateef 04/20/2017.   Diagnosis: ESRD with Functioning AVF/AVGG  Plan:  Remove right diatek catheter  Consent signed:  yes Time out completed:  yes Coumadin:  No. PT/INR (if applicable):   Other labs:   Procedure: 1.  Sterile prepping and draping over catheter area 2. 0 ml 2% lidocaine plain instilled at removal site. 3.  right catheter removed in its entirety with cuff in tact. 4.  Complications: none  5. Tip of catheter sent for culture:  no   Patient tolerated procedure well:  yes Pressure held, no bleeding noted, dressing applied Instructions given to the pt regarding wound care and bleeding.  OtherClinton Gallant:  Pershing Skidmore [facility] 04/21/2017 9:28 AM

## 2017-04-22 LAB — RENAL FUNCTION PANEL
ALBUMIN: 2.4 g/dL — AB (ref 3.5–5.0)
Anion gap: 6 (ref 5–15)
BUN: 57 mg/dL — ABNORMAL HIGH (ref 6–20)
CALCIUM: 8.4 mg/dL — AB (ref 8.9–10.3)
CHLORIDE: 102 mmol/L (ref 101–111)
CO2: 34 mmol/L — ABNORMAL HIGH (ref 22–32)
CREATININE: 1.64 mg/dL — AB (ref 0.61–1.24)
GFR, EST AFRICAN AMERICAN: 41 mL/min — AB (ref >60)
GFR, EST NON AFRICAN AMERICAN: 35 mL/min — AB (ref >60)
Glucose, Bld: 84 mg/dL (ref 65–99)
PHOSPHORUS: 3.9 mg/dL (ref 2.5–4.6)
Potassium: 4.1 mmol/L (ref 3.5–5.1)
Sodium: 142 mmol/L (ref 135–145)

## 2017-04-22 LAB — MAGNESIUM: MAGNESIUM: 1.9 mg/dL (ref 1.7–2.4)

## 2017-04-22 NOTE — Progress Notes (Signed)
[facility]  ROUNDING NOTE   Subjective:  PermCath has been removed. Patient continues to have good urine output. Potassium now normalized at 4.5.   Objective:  Vital signs in last 24 hours:  Temperature 96.3 pulse 98 respirations 17 blood pressure 142/81   Physical Exam: General: Chronically ill appearing  Head: Normocephalic, atraumatic. Moist oral mucosal membranes  Eyes: Anicteric  Neck: Supple, trachea midline  Lungs:  Clear to auscultation, normal effort  Heart: S1S2 no rubs  Abdomen:  Soft, nontender, bowel sounds present  Extremities: 1+ peripheral edema.  Neurologic: Awake, alert, following commands  Skin: Hyperkeratosis b/l LE's noted, bilateral LE rubor  Access: Right internal jugular PermCath removed     Basic Metabolic Panel:  Recent Labs Lab 04/19/17 0537 04/20/17 0620 04/21/17 0650  NA 139 139 141  K 5.6* 5.8* 4.5  CL 104 105 102  CO2 29 26 30   GLUCOSE 107* 123* 102*  BUN 50* 52* 56*  CREATININE 1.87* 1.78* 1.67*  CALCIUM 8.5* 8.4* 8.2*  MG 1.7 1.6* 1.6*  PHOS 3.5 4.4 4.1    Liver Function Tests:  Recent Labs Lab 04/19/17 0537 04/20/17 0620 04/21/17 0650  ALBUMIN 2.4* 2.4* 2.4*   No results for input(s): LIPASE, AMYLASE in the last 168 hours. No results for input(s): AMMONIA in the last 168 hours.  CBC:  Recent Labs Lab 04/18/17 0543 04/19/17 0537 04/20/17 0620 04/21/17 0650  WBC 10.5 11.2* 9.7 9.3  HGB 11.2* 11.1* 11.7* 11.3*  HCT 37.5* 36.8* 37.7* 37.7*  MCV 94.9 94.8 95.4 95.9  PLT 220 242 273 279    Cardiac Enzymes: No results for input(s): CKTOTAL, CKMB, CKMBINDEX, TROPONINI in the last 168 hours.  BNP: Invalid input(s): POCBNP  CBG: No results for input(s): GLUCAP in the last 168 hours.  Microbiology: Results for orders placed or performed during the hospital encounter of 04/02/17  Culture, respiratory (NON-Expectorated)     Status: None   Collection Time: 04/05/17 11:52 PM  Result Value Ref  Range Status   Specimen Description TRACHEAL ASPIRATE  Final   Special Requests NONE  Final   Gram Stain   Final    ABUNDANT WBC PRESENT,BOTH PMN AND MONONUCLEAR RARE SQUAMOUS EPITHELIAL CELLS PRESENT FEW GRAM NEGATIVE RODS FEW GRAM POSITIVE COCCI FEW YEAST RARE GRAM POSITIVE RODS    Culture MODERATE ACHROMOBACTER XYLOSOXIDANS  Final   Report Status 04/09/2017 FINAL  Final   Organism ID, Bacteria ACHROMOBACTER XYLOSOXIDANS  Final      Susceptibility   Achromobacter xylosoxidans - MIC*    CEFEPIME >=64 RESISTANT Resistant     CEFAZOLIN >=64 RESISTANT Resistant     GENTAMICIN >=16 RESISTANT Resistant     CIPROFLOXACIN >=4 RESISTANT Resistant     IMIPENEM 1 SENSITIVE Sensitive     TRIMETH/SULFA <=20 SENSITIVE Sensitive     * MODERATE ACHROMOBACTER XYLOSOXIDANS  Culture, Urine     Status: None   Collection Time: 04/06/17  4:01 AM  Result Value Ref Range Status   Specimen Description URINE, RANDOM  Final   Special Requests NONE  Final   Culture NO GROWTH  Final   Report Status 04/07/2017 FINAL  Final    Coagulation Studies:  Recent Labs  04/20/17 0620 04/21/17 0650  LABPROT 16.6* 16.0*  INR 1.35 1.30    Urinalysis: No results for input(s): COLORURINE, LABSPEC, PHURINE, GLUCOSEU, HGBUR, BILIRUBINUR, KETONESUR, PROTEINUR, UROBILINOGEN, NITRITE, LEUKOCYTESUR in the last 72 hours.  Invalid input(s): APPERANCEUR    Imaging: No results found.  Medications:       Assessment/ Plan:  83 y.o. male with a PMHx of COPD, diabetes mellitus type 2, GERD, aortic stenosis, hypertension, osteoporosis, history of TIA, who was admitted to [facility] on 04/02/2017 for ongoing treatment of recent acute respiratory failure, COPD exacerbation, pneumonia, and acute kidney injury.   1. Acute renal failure secondary to acute tubular necrosis requiring dialysis. - Creatinine continues to decline and is currently down to 1.6. Good urine output. PermCath has been discontinued.  Appreciate vascular surgery assistance.  2. Acute respiratory failure/bacterial pneumonia. Management as per hospitalist.  3. Secondary hyperparathyroidism.  Phosphorus acceptable at 4.1. Continue to periodically monitor.  4. Hyperkalemia. Potassium down to 4.5 and acceptable. Continue to periodically monitor.   LOS: 0 Jay Walton 10/19/20188:21 AM

## 2017-04-25 LAB — CBC
HCT: 37.1 % — ABNORMAL LOW (ref 39.0–52.0)
Hemoglobin: 10.9 g/dL — ABNORMAL LOW (ref 13.0–17.0)
MCH: 28.8 pg (ref 26.0–34.0)
MCHC: 29.4 g/dL — ABNORMAL LOW (ref 30.0–36.0)
MCV: 97.9 fL (ref 78.0–100.0)
PLATELETS: 302 10*3/uL (ref 150–400)
RBC: 3.79 MIL/uL — AB (ref 4.22–5.81)
RDW: 15.7 % — AB (ref 11.5–15.5)
WBC: 9.5 10*3/uL (ref 4.0–10.5)

## 2017-04-25 LAB — MAGNESIUM: Magnesium: 1.8 mg/dL (ref 1.7–2.4)

## 2017-04-25 LAB — RENAL FUNCTION PANEL
Albumin: 2.4 g/dL — ABNORMAL LOW (ref 3.5–5.0)
Anion gap: 11 (ref 5–15)
BUN: 48 mg/dL — AB (ref 6–20)
CALCIUM: 8.6 mg/dL — AB (ref 8.9–10.3)
CHLORIDE: 102 mmol/L (ref 101–111)
CO2: 31 mmol/L (ref 22–32)
CREATININE: 1.46 mg/dL — AB (ref 0.61–1.24)
GFR, EST AFRICAN AMERICAN: 47 mL/min — AB (ref >60)
GFR, EST NON AFRICAN AMERICAN: 40 mL/min — AB (ref >60)
Glucose, Bld: 94 mg/dL (ref 65–99)
Phosphorus: 3.8 mg/dL (ref 2.5–4.6)
Potassium: 4 mmol/L (ref 3.5–5.1)
SODIUM: 144 mmol/L (ref 135–145)

## 2017-04-26 LAB — PROTIME-INR
INR: 1.1
PROTHROMBIN TIME: 14.2 s (ref 11.4–15.2)

## 2017-04-26 NOTE — Progress Notes (Signed)
[facility]  ROUNDING NOTE   Subjective:  Renal function remains stable.  last creatinine was 1.46.  patient continues to have good urine output.   Objective:  Vital signs in last 24 hours:  Temperature 97.5 pulse 110 respirations 22 blood pressure 145/77   Physical Exam: General: Chronically ill appearing  Head: Normocephalic, atraumatic. Moist oral mucosal membranes  Eyes: Anicteric  Neck: Supple, trachea midline  Lungs:  Clear to auscultation, normal effort  Heart: S1S2 no rubs  Abdomen:  Soft, nontender, bowel sounds present  Extremities: 1+ peripheral edema.  Neurologic: Awake, alert, following commands  Skin: Hyperkeratosis b/l LE's noted, bilateral LE rubor  Access: none    Basic Metabolic Panel:  Recent Labs Lab 04/20/17 0620 04/21/17 0650 04/22/17 0856 04/25/17 0755  NA 139 141 142 144  K 5.8* 4.5 4.1 4.0  CL 105 102 102 102  CO2 26 30 34* 31  GLUCOSE 123* 102* 84 94  BUN 52* 56* 57* 48*  CREATININE 1.78* 1.67* 1.64* 1.46*  CALCIUM 8.4* 8.2* 8.4* 8.6*  MG 1.6* 1.6* 1.9 1.8  PHOS 4.4 4.1 3.9 3.8    Liver Function Tests:  Recent Labs Lab 04/20/17 0620 04/21/17 0650 04/22/17 0856 04/25/17 0755  ALBUMIN 2.4* 2.4* 2.4* 2.4*   No results for input(s): LIPASE, AMYLASE in the last 168 hours. No results for input(s): AMMONIA in the last 168 hours.  CBC:  Recent Labs Lab 04/20/17 0620 04/21/17 0650 04/25/17 0755  WBC 9.7 9.3 9.5  HGB 11.7* 11.3* 10.9*  HCT 37.7* 37.7* 37.1*  MCV 95.4 95.9 97.9  PLT 273 279 302    Cardiac Enzymes: No results for input(s): CKTOTAL, CKMB, CKMBINDEX, TROPONINI in the last 168 hours.  BNP: Invalid input(s): POCBNP  CBG: No results for input(s): GLUCAP in the last 168 hours.  Microbiology: Results for orders placed or performed during the hospital encounter of 04/02/17  Culture, respiratory (NON-Expectorated)     Status: None   Collection Time: 04/05/17 11:52 PM  Result Value Ref Range  Status   Specimen Description TRACHEAL ASPIRATE  Final   Special Requests NONE  Final   Gram Stain   Final    ABUNDANT WBC PRESENT,BOTH PMN AND MONONUCLEAR RARE SQUAMOUS EPITHELIAL CELLS PRESENT FEW GRAM NEGATIVE RODS FEW GRAM POSITIVE COCCI FEW YEAST RARE GRAM POSITIVE RODS    Culture MODERATE ACHROMOBACTER XYLOSOXIDANS  Final   Report Status 04/09/2017 FINAL  Final   Organism ID, Bacteria ACHROMOBACTER XYLOSOXIDANS  Final      Susceptibility   Achromobacter xylosoxidans - MIC*    CEFEPIME >=64 RESISTANT Resistant     CEFAZOLIN >=64 RESISTANT Resistant     GENTAMICIN >=16 RESISTANT Resistant     CIPROFLOXACIN >=4 RESISTANT Resistant     IMIPENEM 1 SENSITIVE Sensitive     TRIMETH/SULFA <=20 SENSITIVE Sensitive     * MODERATE ACHROMOBACTER XYLOSOXIDANS  Culture, Urine     Status: None   Collection Time: 04/06/17  4:01 AM  Result Value Ref Range Status   Specimen Description URINE, RANDOM  Final   Special Requests NONE  Final   Culture NO GROWTH  Final   Report Status 04/07/2017 FINAL  Final    Coagulation Studies:  Recent Labs  04/26/17 0612  LABPROT 14.2  INR 1.10    Urinalysis: No results for input(s): COLORURINE, LABSPEC, PHURINE, GLUCOSEU, HGBUR, BILIRUBINUR, KETONESUR, PROTEINUR, UROBILINOGEN, NITRITE, LEUKOCYTESUR in the last 72 hours.  Invalid input(s): APPERANCEUR    Imaging: No results found.  Medications:       Assessment/ Plan:  83 y.o. male with a PMHx of COPD, diabetes mellitus type 2, GERD, aortic stenosis, hypertension, osteoporosis, history of TIA, who was admitted to [facility] on 04/02/2017 for ongoing treatment of recent acute respiratory failure, COPD exacerbation, pneumonia, and acute kidney injury.   1. Acute renal failure secondary to acute tubular necrosis requiring dialysis. - creatinine continues to trend down. Most recent creatinine was 1.46. Patient continues have good urine output. No further need for dialysis.  2.  Acute respiratory failure/bacterial pneumonia. Appears to be stable from this perspective. Patient on nasal cannula.  3. Secondary hyperparathyroidism.  Phosphorus down to 3.8 and acceptable. Continue to periodically monitor.  4. Hyperkalemia. Hyperkalemia now has improved. Potassium down to 4.0 at last check.   LOS: 0 Jay Walton 10/23/20183:35 PM

## 2017-04-27 LAB — BASIC METABOLIC PANEL
Anion gap: 9 (ref 5–15)
BUN: 47 mg/dL — AB (ref 6–20)
CHLORIDE: 101 mmol/L (ref 101–111)
CO2: 33 mmol/L — AB (ref 22–32)
CREATININE: 1.43 mg/dL — AB (ref 0.61–1.24)
Calcium: 8.6 mg/dL — ABNORMAL LOW (ref 8.9–10.3)
GFR calc Af Amer: 48 mL/min — ABNORMAL LOW (ref >60)
GFR calc non Af Amer: 41 mL/min — ABNORMAL LOW (ref >60)
GLUCOSE: 110 mg/dL — AB (ref 65–99)
POTASSIUM: 4.4 mmol/L (ref 3.5–5.1)
SODIUM: 143 mmol/L (ref 135–145)

## 2017-04-27 LAB — CBC
HEMATOCRIT: 39.9 % (ref 39.0–52.0)
Hemoglobin: 11.6 g/dL — ABNORMAL LOW (ref 13.0–17.0)
MCH: 28.5 pg (ref 26.0–34.0)
MCHC: 29.1 g/dL — AB (ref 30.0–36.0)
MCV: 98 fL (ref 78.0–100.0)
PLATELETS: 275 10*3/uL (ref 150–400)
RBC: 4.07 MIL/uL — ABNORMAL LOW (ref 4.22–5.81)
RDW: 15.7 % — AB (ref 11.5–15.5)
WBC: 11.1 10*3/uL — ABNORMAL HIGH (ref 4.0–10.5)

## 2017-04-27 LAB — PROTIME-INR
INR: 1.16
PROTHROMBIN TIME: 14.7 s (ref 11.4–15.2)

## 2017-04-27 LAB — MAGNESIUM: Magnesium: 1.7 mg/dL (ref 1.7–2.4)

## 2017-04-27 LAB — PHOSPHORUS: Phosphorus: 3.4 mg/dL (ref 2.5–4.6)

## 2017-04-28 LAB — CBC
HEMATOCRIT: 37.2 % — AB (ref 39.0–52.0)
Hemoglobin: 10.7 g/dL — ABNORMAL LOW (ref 13.0–17.0)
MCH: 28.3 pg (ref 26.0–34.0)
MCHC: 28.8 g/dL — ABNORMAL LOW (ref 30.0–36.0)
MCV: 98.4 fL (ref 78.0–100.0)
Platelets: 259 10*3/uL (ref 150–400)
RBC: 3.78 MIL/uL — ABNORMAL LOW (ref 4.22–5.81)
RDW: 16 % — ABNORMAL HIGH (ref 11.5–15.5)
WBC: 11.2 10*3/uL — ABNORMAL HIGH (ref 4.0–10.5)

## 2017-04-28 LAB — BASIC METABOLIC PANEL
ANION GAP: 8 (ref 5–15)
BUN: 44 mg/dL — AB (ref 6–20)
CALCIUM: 8.8 mg/dL — AB (ref 8.9–10.3)
CO2: 37 mmol/L — ABNORMAL HIGH (ref 22–32)
Chloride: 100 mmol/L — ABNORMAL LOW (ref 101–111)
Creatinine, Ser: 1.56 mg/dL — ABNORMAL HIGH (ref 0.61–1.24)
GFR calc Af Amer: 43 mL/min — ABNORMAL LOW (ref >60)
GFR, EST NON AFRICAN AMERICAN: 37 mL/min — AB (ref >60)
GLUCOSE: 118 mg/dL — AB (ref 65–99)
Potassium: 4.1 mmol/L (ref 3.5–5.1)
Sodium: 145 mmol/L (ref 135–145)

## 2017-04-28 LAB — PROTIME-INR
INR: 1.24
Prothrombin Time: 15.5 seconds — ABNORMAL HIGH (ref 11.4–15.2)

## 2017-04-28 LAB — MAGNESIUM: MAGNESIUM: 1.7 mg/dL (ref 1.7–2.4)

## 2017-04-28 LAB — PHOSPHORUS: PHOSPHORUS: 3.3 mg/dL (ref 2.5–4.6)

## 2017-04-29 LAB — PROTIME-INR
INR: 1.14
Prothrombin Time: 14.6 seconds (ref 11.4–15.2)

## 2017-04-30 LAB — CBC WITH DIFFERENTIAL/PLATELET
BASOS PCT: 0 %
Basophils Absolute: 0 10*3/uL (ref 0.0–0.1)
Eosinophils Absolute: 0.1 10*3/uL (ref 0.0–0.7)
Eosinophils Relative: 1 %
HEMATOCRIT: 37 % — AB (ref 39.0–52.0)
HEMOGLOBIN: 11 g/dL — AB (ref 13.0–17.0)
LYMPHS ABS: 1.2 10*3/uL (ref 0.7–4.0)
LYMPHS PCT: 11 %
MCH: 29.2 pg (ref 26.0–34.0)
MCHC: 29.7 g/dL — AB (ref 30.0–36.0)
MCV: 98.1 fL (ref 78.0–100.0)
MONOS PCT: 5 %
Monocytes Absolute: 0.5 10*3/uL (ref 0.1–1.0)
NEUTROS ABS: 9.1 10*3/uL — AB (ref 1.7–7.7)
NEUTROS PCT: 83 %
Platelets: 257 10*3/uL (ref 150–400)
RBC: 3.77 MIL/uL — ABNORMAL LOW (ref 4.22–5.81)
RDW: 15.7 % — ABNORMAL HIGH (ref 11.5–15.5)
WBC: 11 10*3/uL — ABNORMAL HIGH (ref 4.0–10.5)

## 2017-04-30 LAB — BASIC METABOLIC PANEL
Anion gap: 8 (ref 5–15)
BUN: 47 mg/dL — ABNORMAL HIGH (ref 6–20)
CHLORIDE: 100 mmol/L — AB (ref 101–111)
CO2: 34 mmol/L — AB (ref 22–32)
CREATININE: 1.64 mg/dL — AB (ref 0.61–1.24)
Calcium: 8.7 mg/dL — ABNORMAL LOW (ref 8.9–10.3)
GFR calc non Af Amer: 35 mL/min — ABNORMAL LOW (ref >60)
GFR, EST AFRICAN AMERICAN: 41 mL/min — AB (ref >60)
GLUCOSE: 139 mg/dL — AB (ref 65–99)
Potassium: 3.8 mmol/L (ref 3.5–5.1)
Sodium: 142 mmol/L (ref 135–145)

## 2017-05-01 ENCOUNTER — Other Ambulatory Visit (HOSPITAL_COMMUNITY): Payer: Self-pay

## 2017-05-01 LAB — PROTIME-INR
INR: 1.5
PROTHROMBIN TIME: 18 s — AB (ref 11.4–15.2)

## 2017-05-02 LAB — PROTIME-INR
INR: 1.76
PROTHROMBIN TIME: 20.3 s — AB (ref 11.4–15.2)

## 2017-05-03 ENCOUNTER — Other Ambulatory Visit (HOSPITAL_COMMUNITY): Payer: Self-pay

## 2017-05-03 LAB — RENAL FUNCTION PANEL
Albumin: 2.2 g/dL — ABNORMAL LOW (ref 3.5–5.0)
Anion gap: 9 (ref 5–15)
BUN: 50 mg/dL — ABNORMAL HIGH (ref 6–20)
CO2: 38 mmol/L — AB (ref 22–32)
Calcium: 8.9 mg/dL (ref 8.9–10.3)
Chloride: 100 mmol/L — ABNORMAL LOW (ref 101–111)
Creatinine, Ser: 1.78 mg/dL — ABNORMAL HIGH (ref 0.61–1.24)
GFR calc non Af Amer: 32 mL/min — ABNORMAL LOW (ref >60)
GFR, EST AFRICAN AMERICAN: 37 mL/min — AB (ref >60)
GLUCOSE: 96 mg/dL (ref 65–99)
PHOSPHORUS: 3.9 mg/dL (ref 2.5–4.6)
POTASSIUM: 4.3 mmol/L (ref 3.5–5.1)
Sodium: 147 mmol/L — ABNORMAL HIGH (ref 135–145)

## 2017-05-03 LAB — CBC
HEMATOCRIT: 36.7 % — AB (ref 39.0–52.0)
HEMOGLOBIN: 10.4 g/dL — AB (ref 13.0–17.0)
MCH: 28.3 pg (ref 26.0–34.0)
MCHC: 28.3 g/dL — AB (ref 30.0–36.0)
MCV: 100 fL (ref 78.0–100.0)
Platelets: 263 10*3/uL (ref 150–400)
RBC: 3.67 MIL/uL — AB (ref 4.22–5.81)
RDW: 16.1 % — ABNORMAL HIGH (ref 11.5–15.5)
WBC: 12.2 10*3/uL — ABNORMAL HIGH (ref 4.0–10.5)

## 2017-05-03 LAB — PROTIME-INR
INR: 2.22
PROTHROMBIN TIME: 24.4 s — AB (ref 11.4–15.2)

## 2017-05-03 LAB — MAGNESIUM: Magnesium: 1.8 mg/dL (ref 1.7–2.4)

## 2017-05-04 ENCOUNTER — Other Ambulatory Visit (HOSPITAL_COMMUNITY): Payer: Self-pay

## 2017-05-04 LAB — RENAL FUNCTION PANEL
ALBUMIN: 2.3 g/dL — AB (ref 3.5–5.0)
Anion gap: 10 (ref 5–15)
BUN: 46 mg/dL — AB (ref 6–20)
CALCIUM: 8.8 mg/dL — AB (ref 8.9–10.3)
CO2: 38 mmol/L — AB (ref 22–32)
CREATININE: 1.77 mg/dL — AB (ref 0.61–1.24)
Chloride: 99 mmol/L — ABNORMAL LOW (ref 101–111)
GFR calc Af Amer: 37 mL/min — ABNORMAL LOW (ref >60)
GFR calc non Af Amer: 32 mL/min — ABNORMAL LOW (ref >60)
GLUCOSE: 108 mg/dL — AB (ref 65–99)
PHOSPHORUS: 4.2 mg/dL (ref 2.5–4.6)
Potassium: 3.8 mmol/L (ref 3.5–5.1)
SODIUM: 147 mmol/L — AB (ref 135–145)

## 2017-05-04 LAB — MAGNESIUM: Magnesium: 1.7 mg/dL (ref 1.7–2.4)

## 2017-05-05 ENCOUNTER — Other Ambulatory Visit (HOSPITAL_COMMUNITY): Payer: Self-pay

## 2017-05-05 LAB — RENAL FUNCTION PANEL
ANION GAP: 9 (ref 5–15)
Albumin: 2.5 g/dL — ABNORMAL LOW (ref 3.5–5.0)
BUN: 47 mg/dL — ABNORMAL HIGH (ref 6–20)
CHLORIDE: 100 mmol/L — AB (ref 101–111)
CO2: 40 mmol/L — AB (ref 22–32)
Calcium: 9.1 mg/dL (ref 8.9–10.3)
Creatinine, Ser: 1.74 mg/dL — ABNORMAL HIGH (ref 0.61–1.24)
GFR calc Af Amer: 38 mL/min — ABNORMAL LOW (ref >60)
GFR calc non Af Amer: 33 mL/min — ABNORMAL LOW (ref >60)
GLUCOSE: 137 mg/dL — AB (ref 65–99)
Phosphorus: 4.6 mg/dL (ref 2.5–4.6)
Potassium: 4.2 mmol/L (ref 3.5–5.1)
Sodium: 149 mmol/L — ABNORMAL HIGH (ref 135–145)

## 2017-05-05 LAB — CBC
HEMATOCRIT: 40.7 % (ref 39.0–52.0)
Hemoglobin: 11.5 g/dL — ABNORMAL LOW (ref 13.0–17.0)
MCH: 29.3 pg (ref 26.0–34.0)
MCHC: 28.3 g/dL — AB (ref 30.0–36.0)
MCV: 103.8 fL — ABNORMAL HIGH (ref 78.0–100.0)
Platelets: 264 10*3/uL (ref 150–400)
RBC: 3.92 MIL/uL — ABNORMAL LOW (ref 4.22–5.81)
RDW: 16.1 % — AB (ref 11.5–15.5)
WBC: 14.2 10*3/uL — ABNORMAL HIGH (ref 4.0–10.5)

## 2017-05-05 LAB — MAGNESIUM: MAGNESIUM: 1.8 mg/dL (ref 1.7–2.4)

## 2017-05-05 DEATH — deceased

## 2017-06-04 NOTE — Progress Notes (Signed)
PA and US tech to unit for possible thoracentesis.  Patient in asystole.  DNR.  [facility] staff in room with patient.  Will cancel order for thoracentesis.   Loyce DysKacie Lorimer Tiberio, MS RD PA-C 4:52 PM

## 2017-06-04 DEATH — deceased

## 2019-06-21 IMAGING — DX DG CHEST 1V PORT
1 series · 1 of 1 positions shown · non-contrast
Comparison: 01/10/2007

CLINICAL DATA: Respiratory failure

EXAM:
PORTABLE CHEST 1 VIEW

[chest]
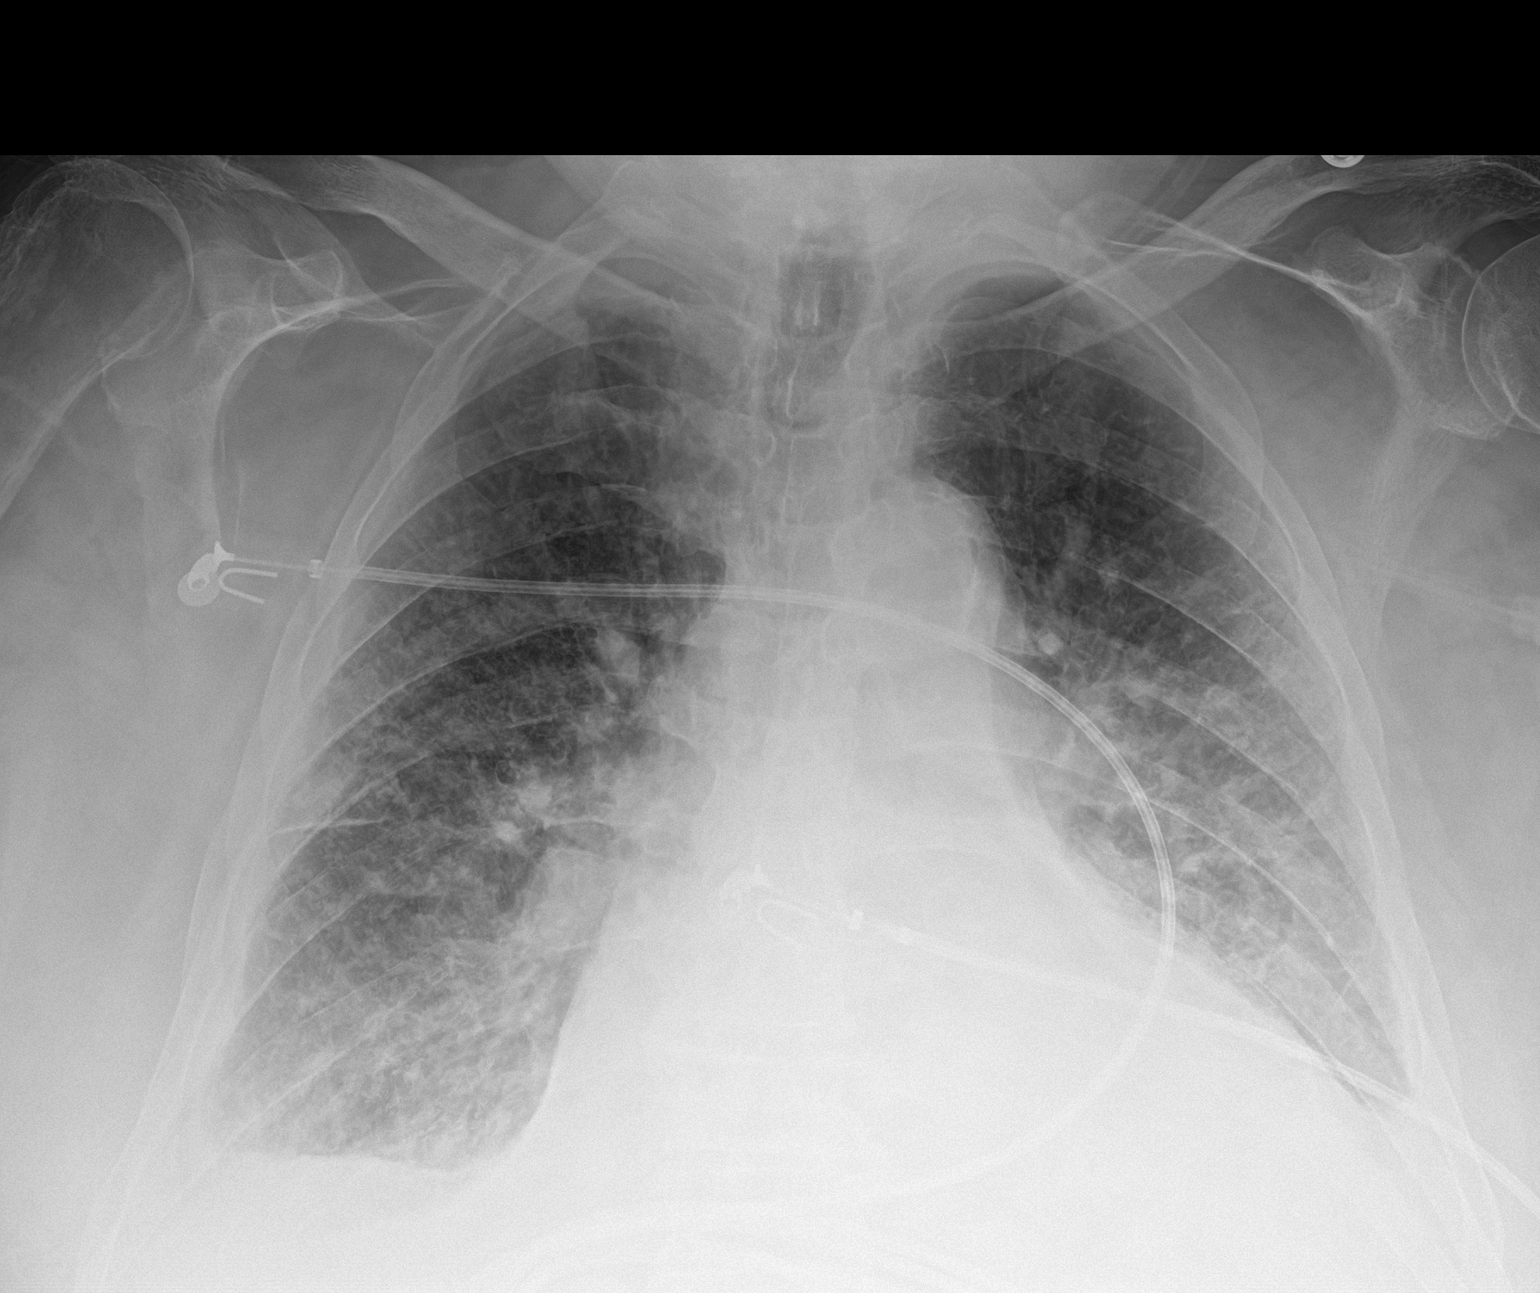

[1 of 1 positions shown; findings below may reference images not displayed]

FINDINGS: Cardiomegaly with aortic atherosclerosis. Central vascular and
interstitial edema with small bilateral pleural effusions.
Retrocardiac opacity cannot exclude a retrocardiac infiltrate,
atelectasis or effusion. No acute nor suspicious osseous appearing
abnormalities.
IMPRESSION: Cardiomegaly with mild CHF and bilateral pleural effusions.
Increased retrocardiac density is noted that cannot entirely exclude
a pneumonic consolidation, atelectasis or effusion at the left lung
base.

## 2019-06-26 IMAGING — CR DG CHEST 1V PORT
1 series · 1 of 1 positions shown · non-contrast
Comparison: Portable chest x-ray April 02, 2017

CLINICAL DATA: Status post dialysis catheter placement

EXAM:
PORTABLE CHEST 1 VIEW

[AP]
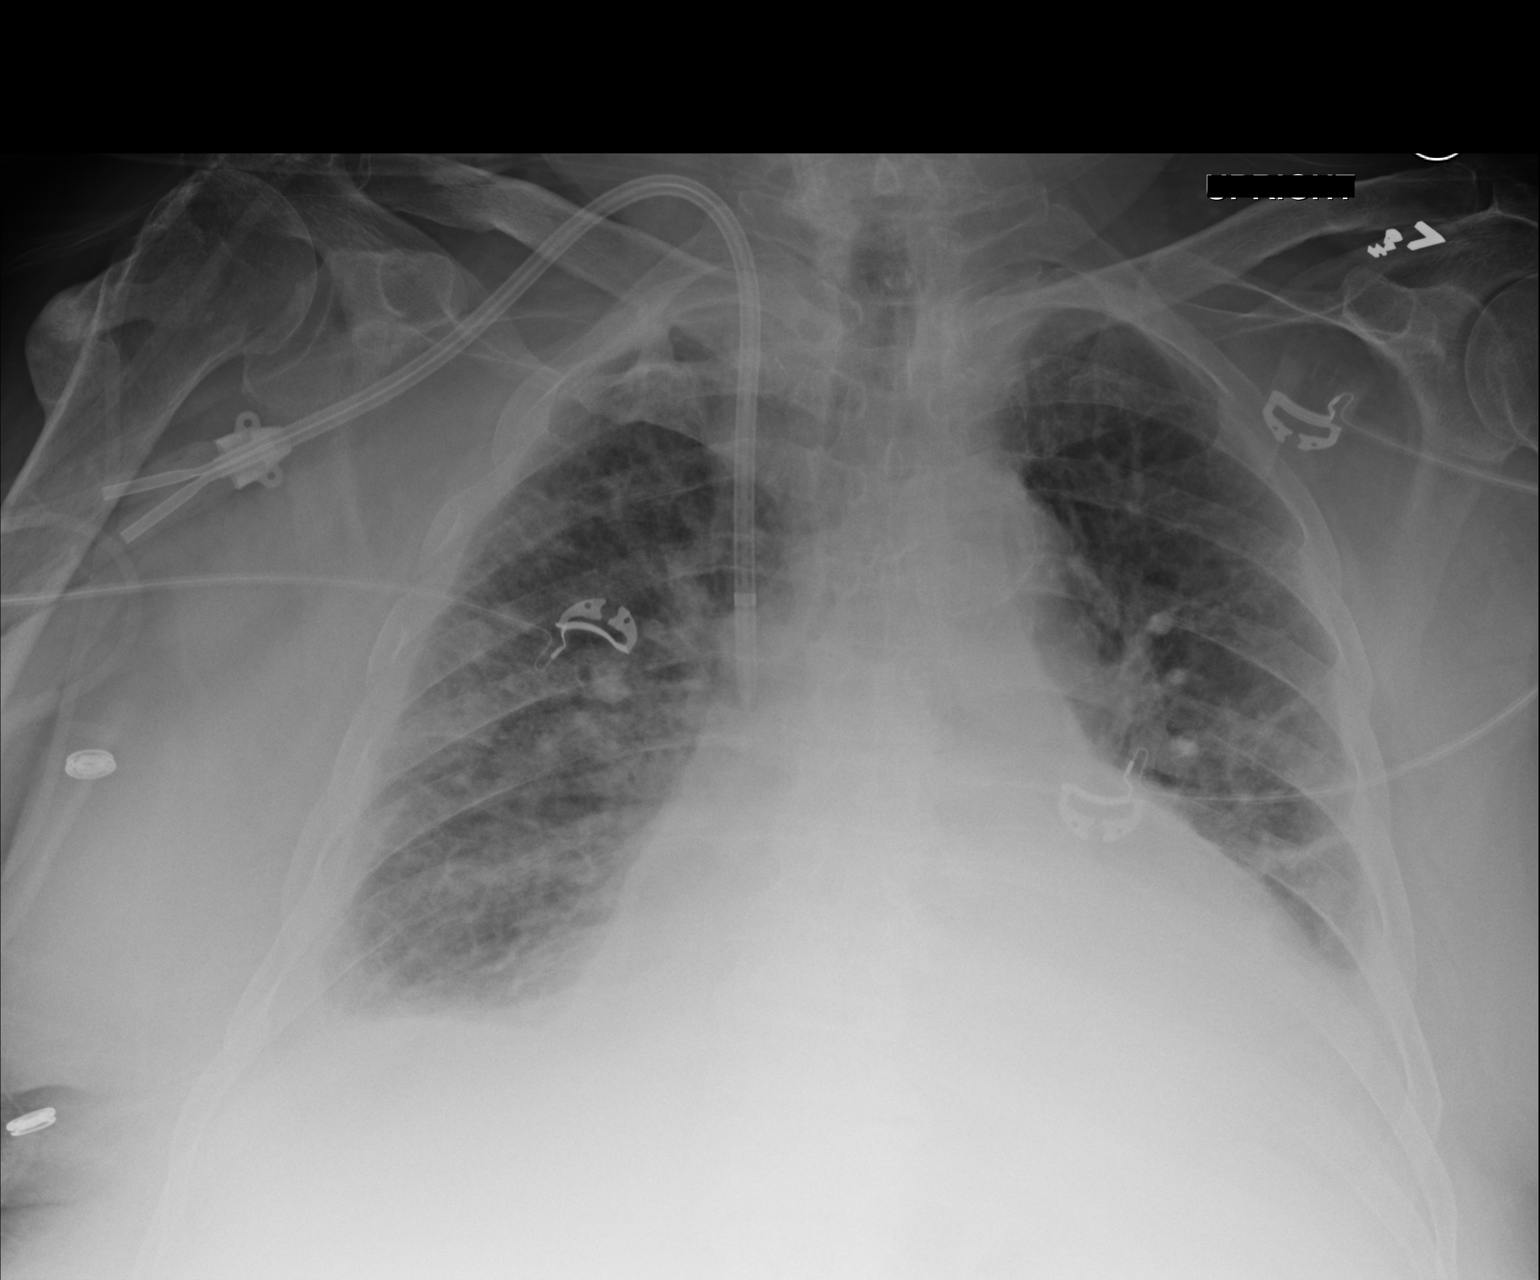

[1 of 1 positions shown; findings below may reference images not displayed]

FINDINGS: The lungs are adequately inflated. The interstitial markings are
increased similar to that seen previously. The cardiac silhouette is
enlarged. There small bilateral pleural effusions. The dual-lumen
dialysis catheter tip projects over the midportion of the SVC. There
is no postprocedure pneumothorax. There is calcification in the wall
of the aortic arch. The observed bony thorax exhibits no acute
abnormalities.
IMPRESSION: CHF with mild interstitial edema and small bilateral pleural
effusions. No postprocedure complication following dialysis catheter
placement.

Thoracic aortic atherosclerosis.
